# Patient Record
Sex: Male | Born: 1965 | Race: White | Hispanic: No | Marital: Married | Smoking: Former smoker
Health system: Southern US, Community
[De-identification: ages and names within clinical notes are randomized; demographics above are authoritative.]

## PROBLEM LIST (undated history)

## (undated) DIAGNOSIS — R079 Chest pain, unspecified: Secondary | ICD-10-CM

---
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
## 1999-06-10 ENCOUNTER — Encounter: Payer: Self-pay | Admitting: Emergency Medicine

## 1999-06-10 ENCOUNTER — Emergency Department (HOSPITAL_COMMUNITY): Admission: EM | Admit: 1999-06-10 | Discharge: 1999-06-10 | Payer: Self-pay | Admitting: Emergency Medicine

## 2002-06-17 ENCOUNTER — Encounter: Payer: Self-pay | Admitting: Emergency Medicine

## 2002-06-17 ENCOUNTER — Emergency Department (HOSPITAL_COMMUNITY): Admission: EM | Admit: 2002-06-17 | Discharge: 2002-06-17 | Payer: Self-pay | Admitting: Emergency Medicine

## 2006-04-11 ENCOUNTER — Emergency Department (HOSPITAL_COMMUNITY): Admission: EM | Admit: 2006-04-11 | Discharge: 2006-04-12 | Payer: Self-pay | Admitting: Emergency Medicine

## 2006-09-24 ENCOUNTER — Emergency Department (HOSPITAL_COMMUNITY): Admission: EM | Admit: 2006-09-24 | Discharge: 2006-09-24 | Payer: Self-pay | Admitting: Emergency Medicine

## 2010-01-22 ENCOUNTER — Emergency Department (HOSPITAL_COMMUNITY): Admission: EM | Admit: 2010-01-22 | Discharge: 2010-01-22 | Payer: Self-pay | Admitting: Emergency Medicine

## 2010-05-24 LAB — CBC
HCT: 42.8 % (ref 39.0–52.0)
Hemoglobin: 14.8 g/dL (ref 13.0–17.0)
MCH: 33.3 pg (ref 26.0–34.0)
MCHC: 34.6 g/dL (ref 30.0–36.0)
MCV: 96.2 fL (ref 78.0–100.0)
Platelets: 335 10*3/uL (ref 150–400)
RBC: 4.45 MIL/uL (ref 4.22–5.81)
RDW: 13.3 % (ref 11.5–15.5)
WBC: 10 10*3/uL (ref 4.0–10.5)

## 2010-05-24 LAB — COMPREHENSIVE METABOLIC PANEL
ALT: 13 U/L (ref 0–53)
AST: 22 U/L (ref 0–37)
Albumin: 3.3 g/dL — ABNORMAL LOW (ref 3.5–5.2)
Alkaline Phosphatase: 82 U/L (ref 39–117)
BUN: 7 mg/dL (ref 6–23)
CO2: 25 mEq/L (ref 19–32)
Calcium: 8.8 mg/dL (ref 8.4–10.5)
Chloride: 106 mEq/L (ref 96–112)
Creatinine, Ser: 0.88 mg/dL (ref 0.4–1.5)
GFR calc Af Amer: >60 mL/min (ref >60)
GFR calc non Af Amer: >60 mL/min (ref >60)
Glucose, Bld: 91 mg/dL (ref 70–99)
Potassium: 4.2 mEq/L (ref 3.5–5.1)
Sodium: 138 mEq/L (ref 135–145)
Total Bilirubin: 0.5 mg/dL (ref 0.3–1.2)
Total Protein: 6.8 g/dL (ref 6.0–8.3)

## 2010-05-24 LAB — POCT CARDIAC MARKERS
CKMB, poc: <1 ng/mL — ABNORMAL LOW (ref 1.0–8.0)
Myoglobin, poc: 77.9 ng/mL (ref 12–200)
Troponin i, poc: <0.05 ng/mL (ref 0.00–0.09)

## 2010-12-27 LAB — URINALYSIS, ROUTINE W REFLEX MICROSCOPIC
Bilirubin Urine: NEGATIVE
Glucose, UA: NEGATIVE
Hgb urine dipstick: NEGATIVE
Protein, ur: NEGATIVE

## 2010-12-27 LAB — COMPREHENSIVE METABOLIC PANEL
BUN: 9
CO2: 26
Calcium: 8.9
Chloride: 104
Creatinine, Ser: 0.8
GFR calc non Af Amer: >60
Total Bilirubin: 0.5

## 2010-12-27 LAB — DIFFERENTIAL
Basophils Absolute: 0
Lymphocytes Relative: 31
Neutro Abs: 5.7

## 2010-12-27 LAB — CBC
HCT: 46.8
MCHC: 34.6
MCV: 94.6
Platelets: 362
WBC: 9.7

## 2011-04-12 ENCOUNTER — Encounter: Payer: Self-pay | Admitting: Cardiology

## 2011-04-12 ENCOUNTER — Ambulatory Visit: Payer: Self-pay | Admitting: Cardiovascular Disease

## 2011-04-12 ENCOUNTER — Ambulatory Visit (INDEPENDENT_AMBULATORY_CARE_PROVIDER_SITE_OTHER): Payer: Managed Care, Other (non HMO) | Admitting: Cardiology

## 2011-04-12 ENCOUNTER — Encounter: Payer: Self-pay | Admitting: *Deleted

## 2011-04-12 DIAGNOSIS — R079 Chest pain, unspecified: Secondary | ICD-10-CM | POA: Insufficient documentation

## 2011-04-12 DIAGNOSIS — E7800 Pure hypercholesterolemia, unspecified: Secondary | ICD-10-CM

## 2011-04-12 NOTE — Progress Notes (Signed)
Reason for Consult: Chest pain Referring Physician: Dr. Pincus Large Tim Rubio is an 46 y.o. male.  HPI: This pleasant gentleman is seen at the request of Dr. Creta Levin for evaluation of chest pain.  The patient states that he has been experiencing chest pains for about 10 years.  He states that in about 2006 he underwent a stress echo at [facility] cardiology but those records are no longer available.  He was told that no evidence of any blockage was found.  He reports that he has very concerned about his chest pains and discussed this with his primary care provider.  He states that while working down at [employer] last spring he went to the emergency room with chest pain and was ruled out for myocardial infarction.  The emergency room physician told him however that he might still be an eventual victim of a "silent killer" and advised that the patient should undergo a nuclear stress test to be sure that he did not have any blockages.  Patient comes in now to discuss this further.  Of note is the fact that the patient has ongoing smoking of a pack a day.  He has a pertinent family history in that his slightly older brother has had 7 stents.  The patient states that his brother has had cholesterols in the 800 range and that he himself has had cholesterol levels in the 400 range but has been intolerant of any cholesterol medication.  On his own with diet he states that he has been able to bring his total cholesterol down to the 325 range.  At the present time the patient is on no medication at all.  Previously he had been on Lexapro which he stopped.  He does have a past history of chronic anxiety as noted in the notes sent by his primary care provider. 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  As noted he does smoke one pack of cigarettes a day.  No past medical history on file.  Past Surgical History  Procedure Date  . Hernia repair      Family History  Problem Relation Age of Onset  . Diabetes    . Heart disease Brother 39    stents  . Hyperlipidemia    . Hypertension    . Lung cancer      Social History:  reports that he has been smoking Cigarettes.  He has been smoking about 1 pack per day. He does not have any smokeless tobacco history on file. He reports that he drinks alcohol. His drug history not on file.  Allergies: Allergies no known allergies  Medications: I have reviewed the patient's current medications.  Patient takes no medication  No results found for this or any previous visit (from the past 48 hour(s)).  No results found.  The review of systems is negative except as noted above in present illness.  His chest pain is of 2 types.  He has a sharp lancinating-type chest pain which sometimes radiates into his arms.  He also has a dull chest tightness.  Neither of these is consistently brought on by physical activity. Blood pressure 120/60, pulse 70, height 5\' 7"  (1.702 m), weight 177 lb (80.287 kg). The general appearance reveals an anxious middle-aged gentleman in no acute distress.The head and neck exam reveals pupils equal and reactive.  Extraocular movements are full.  There is no scleral icterus.  The mouth and pharynx are normal.  The neck is supple.  The carotids reveal no bruits.  The jugular venous pressure is normal.  The  thyroid is not enlarged.  There is no lymphadenopathy.  The chest is clear to percussion and auscultation.  There are no rales or rhonchi.  Expansion of the chest is symmetrical.  The precordium is quiet.  The first heart sound is normal.  The second heart sound is physiologically split.  There is no murmur gallop rub or click.  There is no abnormal lift or heave.  The abdomen is soft and nontender.  The bowel sounds are normal.  The liver and spleen are not enlarged.  There are no abdominal masses.  There are no abdominal bruits.  Extremities reveal good pedal pulses.  There is no  phlebitis or edema.  There is no cyanosis or clubbing.  Strength is normal and symmetrical in all extremities.  There is no lateralizing weakness.  There are no sensory deficits.  The skin is warm and dry.  There is no rash.  EKG today shows normal sinus rhythm and no ischemic changes.  Assessment/Plan: 1. Chest pain 2. ongoing cigarette abuse 3. hypercholesterolemia by history 4. Anxiety/depression  Plan: We will have the patient return for a treadmill Myoview stress test.  He was counseled about smoking cessation.   Many thanks for the opportunity to see this pleasant gentleman with you and we will be in touch with you regarding the results of the treadmill Myoview stress test  Cassell Clement 04/12/2011, 5:28 PM

## 2011-04-12 NOTE — Patient Instructions (Signed)
Your physician has requested that you have en exercise stress myoview. For further information please visit [email or web address] Please follow instruction sheet, as given.  Will call you with the results

## 2011-04-13 ENCOUNTER — Ambulatory Visit (HOSPITAL_COMMUNITY): Payer: Managed Care, Other (non HMO) | Attending: Cardiovascular Disease | Admitting: Radiology

## 2011-04-13 ENCOUNTER — Encounter (HOSPITAL_COMMUNITY): Payer: Self-pay | Admitting: Radiology

## 2011-04-13 DIAGNOSIS — R079 Chest pain, unspecified: Secondary | ICD-10-CM

## 2011-04-13 DIAGNOSIS — E785 Hyperlipidemia, unspecified: Secondary | ICD-10-CM | POA: Insufficient documentation

## 2011-04-13 DIAGNOSIS — E7800 Pure hypercholesterolemia, unspecified: Secondary | ICD-10-CM | POA: Insufficient documentation

## 2011-04-13 DIAGNOSIS — R61 Generalized hyperhidrosis: Secondary | ICD-10-CM | POA: Insufficient documentation

## 2011-04-13 DIAGNOSIS — Z8249 Family history of ischemic heart disease and other diseases of the circulatory system: Secondary | ICD-10-CM | POA: Insufficient documentation

## 2011-04-13 DIAGNOSIS — F17200 Nicotine dependence, unspecified, uncomplicated: Secondary | ICD-10-CM | POA: Insufficient documentation

## 2011-04-13 DIAGNOSIS — R002 Palpitations: Secondary | ICD-10-CM | POA: Insufficient documentation

## 2011-04-13 MED ORDER — TECHNETIUM TC 99M TETROFOSMIN IV KIT
10.0000 | PACK | Freq: Once | INTRAVENOUS | Status: AC | PRN
  Administered 2011-04-13: 10 via INTRAVENOUS

## 2011-04-13 MED ORDER — TECHNETIUM TC 99M TETROFOSMIN IV KIT
30.0000 | PACK | Freq: Once | INTRAVENOUS | Status: AC | PRN
  Administered 2011-04-13: 30 via INTRAVENOUS

## 2011-04-13 NOTE — Progress Notes (Signed)
[facility] SITE 3 NUCLEAR MED [address] [place] [address] [phone]  Cardiology Nuclear Med Study  Tim Rubio is a 46 y.o. male [id number] [birth year]   Nuclear Med Background Indication for Stress Test:  Evaluation for Ischemia History:2006  Stress Echo- Normal per patient Cardiac Risk Factors: Family History - CAD, Lipids and Smoker  Symptoms:  Chest Pain, Diaphoresis and Palpitations   Nuclear Pre-Procedure Caffeine/Decaff Intake:  None NPO After: 7:30pm   Lungs:  Clear IV 0.9% NS with Angio Cath:  20g  IV Site: R Antecubital  IV Started by:  Stanton Kidney, EMT-P  Chest Size (in):  40 Cup Size: n/a  Height: 5\' 7"  (1.702 m)  Weight:  176 lb (79.833 kg)  BMI:  Body mass index is 27.57 kg/(m^2). Tech Comments:  NA    Nuclear Med Study 1 or 2 day study: 1 day  Stress Test Type:  Stress  Reading MD: Charlton Haws, MD  Order Authorizing Provider:  Cassell Clement, MD  Resting Radionuclide: Technetium 34m Tetrofosmin  Resting Radionuclide Dose: 11.0 mCi   Stress Radionuclide:  Technetium 60m Tetrofosmin  Stress Radionuclide Dose: 33.0 mCi           Stress Protocol Rest HR: 63 Stress HR: 155  Rest BP: 107/73 Stress BP: 159/77  Exercise Time (min): 10:45 METS: 11.7   Predicted Max HR: 174 bpm % Max HR: 89.08 bpm Rate Pressure Product: 21308   Dose of Adenosine (mg):  n/a Dose of Lexiscan: n/a mg  Dose of Atropine (mg): n/a Dose of Dobutamine: n/a mcg/kg/min (at max HR)  Stress Test Technologist: Bonnita Levan, RN  Nuclear Technologist:  Domenic Polite, CNMT     Rest Procedure:  Myocardial perfusion imaging was performed at rest 45 minutes following the intravenous administration of Technetium 12m Tetrofosmin. Rest ECG: NSR  Stress Procedure:  The patient exercised for 10:45.  The patient stopped due to leg fatigue and dyspnea and denied any chest pain.  There were no significant ST-T wave changes.  Technetium 72m Tetrofosmin was  injected at peak exercise and myocardial perfusion imaging was performed after a brief delay. Stress ECG: No significant change from baseline ECG  QPS Raw Data Images:  Normal; no motion artifact; normal heart/lung ratio. Stress Images:  Normal homogeneous uptake in all areas of the myocardium. Rest Images:  Normal homogeneous uptake in all areas of the myocardium. Subtraction (SDS):  Normal Transient Ischemic Dilatation (Normal <1.22):  0.99 Lung/Heart Ratio (Normal <0.45):  0.37  Quantitative Gated Spect Images QGS EDV:  95 ml QGS ESV:  34 ml QGS cine images:  NL LV Function; NL Wall Motion QGS EF: 64%  Impression Exercise Capacity:  Good exercise capacity. BP Response:  Normal blood pressure response. Clinical Symptoms:  No chest pain. ECG Impression:  No significant ST segment change suggestive of ischemia. Comparison with Prior Nuclear Study: No images to compare  Overall Impression:  Normal stress nuclear study.  Charlton Haws

## 2011-04-14 ENCOUNTER — Telehealth: Payer: Self-pay | Admitting: Cardiology

## 2011-04-14 NOTE — Telephone Encounter (Signed)
Pt very nervous and didn't want to wait all weekend for results of stress test. Informed pt that Dr Patty Sermons has not seen the formal results yet but the preliminary results where normal. Told him he will receive final result next week, pt verbalized understanding.

## 2011-04-14 NOTE — Telephone Encounter (Signed)
New problem Pt calling about test results

## 2011-04-17 ENCOUNTER — Telehealth: Payer: Self-pay | Admitting: Cardiology

## 2011-04-17 ENCOUNTER — Telehealth: Payer: Self-pay | Admitting: *Deleted

## 2011-04-17 NOTE — Telephone Encounter (Signed)
Pt rtn someone's call not sure who called

## 2011-04-17 NOTE — Telephone Encounter (Signed)
Advised of stress test results 

## 2011-04-17 NOTE — Telephone Encounter (Signed)
Advised of results and forwarded to Dr Tonna Corner.  If pain comes back call back for further follow up

## 2011-04-17 NOTE — Telephone Encounter (Signed)
Message copied by Burnell Blanks on Mon Apr 17, 2011  3:02 PM ------      Message from: Cassell Clement      Created: Mon Apr 17, 2011  6:03 AM       Please report.  The stress test was normal.  There were no blockages. The EF is normal.      Continue strict low cholesterol diet, follow up with Dr. Creta Levin.      Copy to Dr. Creta Levin

## 2011-06-12 ENCOUNTER — Encounter: Payer: Self-pay | Admitting: Cardiology

## 2012-09-13 ENCOUNTER — Observation Stay (HOSPITAL_COMMUNITY)
Admission: EM | Admit: 2012-09-13 | Discharge: 2012-09-15 | Disposition: A | Discharge status: Home / Self Care | Payer: BC Managed Care – PPO | Attending: Surgery | Admitting: Surgery

## 2012-09-13 ENCOUNTER — Emergency Department (HOSPITAL_COMMUNITY): Payer: BC Managed Care – PPO

## 2012-09-13 ENCOUNTER — Encounter (HOSPITAL_COMMUNITY): Payer: Self-pay | Admitting: Emergency Medicine

## 2012-09-13 DIAGNOSIS — K8020 Calculus of gallbladder without cholecystitis without obstruction: Secondary | ICD-10-CM

## 2012-09-13 DIAGNOSIS — R112 Nausea with vomiting, unspecified: Secondary | ICD-10-CM

## 2012-09-13 DIAGNOSIS — K8000 Calculus of gallbladder with acute cholecystitis without obstruction: Secondary | ICD-10-CM | POA: Insufficient documentation

## 2012-09-13 DIAGNOSIS — R1011 Right upper quadrant pain: Secondary | ICD-10-CM

## 2012-09-13 LAB — POCT I-STAT, CHEM 8
BUN: 22 mg/dL (ref 6–23)
Chloride: 104 mEq/L (ref 96–112)
Creatinine, Ser: 1.2 mg/dL (ref 0.50–1.35)
Potassium: 4.1 mEq/L (ref 3.5–5.1)
Sodium: 138 mEq/L (ref 135–145)

## 2012-09-13 LAB — CBC WITH DIFFERENTIAL/PLATELET
Basophils Absolute: 0.1 10*3/uL (ref 0.0–0.1)
HCT: 46.4 % (ref 39.0–52.0)
Hemoglobin: 16.6 g/dL (ref 13.0–17.0)
Lymphocytes Relative: 31 % (ref 12–46)
Lymphs Abs: 3.9 10*3/uL (ref 0.7–4.0)
Monocytes Absolute: 1.2 10*3/uL — ABNORMAL HIGH (ref 0.1–1.0)
Monocytes Relative: 9 % (ref 3–12)
Neutro Abs: 6.9 10*3/uL (ref 1.7–7.7)
RBC: 4.91 MIL/uL (ref 4.22–5.81)
WBC: 12.4 10*3/uL — ABNORMAL HIGH (ref 4.0–10.5)

## 2012-09-13 LAB — COMPREHENSIVE METABOLIC PANEL
ALT: 13 U/L (ref 0–53)
Alkaline Phosphatase: 112 U/L (ref 39–117)
GFR calc Af Amer: >90 mL/min (ref >90)
Glucose, Bld: 112 mg/dL — ABNORMAL HIGH (ref 70–99)
Potassium: 4.1 mEq/L (ref 3.5–5.1)
Sodium: 137 mEq/L (ref 135–145)
Total Protein: 7.4 g/dL (ref 6.0–8.3)

## 2012-09-13 LAB — URINALYSIS, ROUTINE W REFLEX MICROSCOPIC
Glucose, UA: NEGATIVE mg/dL
Hgb urine dipstick: NEGATIVE
Specific Gravity, Urine: 1.023 (ref 1.005–1.030)
Urobilinogen, UA: 1 mg/dL (ref 0.0–1.0)

## 2012-09-13 LAB — POCT I-STAT TROPONIN I

## 2012-09-13 MED ORDER — DIPHENHYDRAMINE HCL 50 MG/ML IJ SOLN: 12.5000 mg | Freq: Four times a day (QID) | INTRAMUSCULAR | Status: DC | PRN

## 2012-09-13 MED ORDER — FENTANYL CITRATE 0.05 MG/ML IJ SOLN
50.0000 ug | Freq: Once | INTRAMUSCULAR | Status: AC
  Administered 2012-09-13: 50 ug via INTRAVENOUS
  Filled 2012-09-13: qty 2

## 2012-09-13 MED ORDER — DIPHENHYDRAMINE HCL 12.5 MG/5ML PO ELIX: 12.5000 mg | ORAL_SOLUTION | Freq: Four times a day (QID) | ORAL | Status: DC | PRN

## 2012-09-13 MED ORDER — HYDROMORPHONE HCL PF 1 MG/ML IJ SOLN
1.0000 mg | Freq: Once | INTRAMUSCULAR | Status: AC
  Administered 2012-09-13: 1 mg via INTRAVENOUS
  Filled 2012-09-13: qty 1

## 2012-09-13 MED ORDER — ONDANSETRON HCL 4 MG/2ML IJ SOLN
4.0000 mg | Freq: Four times a day (QID) | INTRAMUSCULAR | Status: DC | PRN
  Administered 2012-09-14: 4 mg via INTRAVENOUS

## 2012-09-13 MED ORDER — ONDANSETRON HCL 4 MG/2ML IJ SOLN
4.0000 mg | Freq: Once | INTRAMUSCULAR | Status: AC
  Administered 2012-09-13: 4 mg via INTRAVENOUS
  Filled 2012-09-13: qty 2

## 2012-09-13 MED ORDER — SODIUM CHLORIDE 0.9 % IV SOLN
INTRAVENOUS | Status: DC
  Administered 2012-09-13 – 2012-09-14 (×2): via INTRAVENOUS

## 2012-09-13 MED ORDER — FENTANYL CITRATE 0.05 MG/ML IJ SOLN
50.0000 ug | Freq: Once | INTRAMUSCULAR | Status: AC
  Administered 2012-09-13: 50 ug via INTRAVENOUS

## 2012-09-13 MED ORDER — HYDROMORPHONE HCL PF 1 MG/ML IJ SOLN
1.0000 mg | INTRAMUSCULAR | Status: DC | PRN
  Administered 2012-09-14: 1 mg via INTRAVENOUS
  Administered 2012-09-14: 0.5 mg via INTRAVENOUS
  Administered 2012-09-14 (×2): 1 mg via INTRAVENOUS
  Administered 2012-09-15: 0.5 mg via INTRAVENOUS
  Filled 2012-09-13 (×5): qty 1

## 2012-09-13 NOTE — ED Notes (Signed)
Pain currently 5/10 achy dull sharp RUQ abdomen

## 2012-09-13 NOTE — Progress Notes (Signed)
Pt educated on Incentive Spirometry and was able to return demonstrate with teach back. Ordered and explained SCD's to pt. Pt reported that he doesn't need those and might not use them till surgery. Pt has been mobile and able to walk without any issues. Pt reported that he is usually very active. Pt reported that he smokes a pack of cigarettes a day and hasn't tried nicotine patches in the past but might want one while here.

## 2012-09-13 NOTE — H&P (Signed)
Tim Rubio is an 47 y.o. male.   Chief Complaint: Abdominal pain, nausea, vomiting Req: Dr. Silverio Lay HPI: 47 yr old male who presented to [facility] with abdominal pain, nausea and vomiting.  He has actually had abdominal pain for about 5 days that has been waxing and waning.  It began to worsen over the last 24 hrous and he woke at 4am with severe pain.  He denies history of this.  It is associated with nausea and vomiting.  His pain is better with the medicine but not resolved.  He had a inguinal hernia repair as a baby.    U/S showed cholelithiasis and Adenomyomatosis.  Surgery is ask to see to evaluate need for cholecystectomy   History reviewed. No pertinent past medical history.  Past Surgical History  Procedure Laterality Date  . Hernia repair      Family History  Problem Relation Age of Onset  . Diabetes    . Heart disease Brother 39    stents  . Hyperlipidemia    . Hypertension    . Lung cancer     Social History:  reports that he has been smoking Cigarettes.  He has been smoking about 1.00 pack per day. He does not have any smokeless tobacco history on file. He reports that  drinks alcohol. He reports that he does not use illicit drugs.  Allergies: No Known Allergies   (Not in a hospital admission)  Results for orders placed during the hospital encounter of 09/13/12 (from the past 48 hour(s))  CBC WITH DIFFERENTIAL     Status: Abnormal   Collection Time    09/13/12  7:23 AM      Result Value Range   WBC 12.4 (*) 4.0 - 10.5 K/uL   RBC 4.91  4.22 - 5.81 MIL/uL   Hemoglobin 16.6  13.0 - 17.0 g/dL   HCT 56.2  13.0 - 86.5 %   MCV 94.5  78.0 - 100.0 fL   MCH 33.8  26.0 - 34.0 pg   MCHC 35.8  30.0 - 36.0 g/dL   RDW 78.4  69.6 - 29.5 %   Platelets 348  150 - 400 K/uL   Neutrophils Relative % 55  43 - 77 %   Neutro Abs 6.9  1.7 - 7.7 K/uL   Lymphocytes Relative 31  12 - 46 %   Lymphs Abs 3.9  0.7 - 4.0 K/uL   Monocytes Relative 9  3 - 12 %   Monocytes Absolute 1.2 (*) 0.1  - 1.0 K/uL   Eosinophils Relative 4  0 - 5 %   Eosinophils Absolute 0.5  0.0 - 0.7 K/uL   Basophils Relative 0  0 - 1 %   Basophils Absolute 0.1  0.0 - 0.1 K/uL  LIPASE, BLOOD     Status: None   Collection Time    09/13/12  7:23 AM      Result Value Range   Lipase 21  11 - 59 U/L  COMPREHENSIVE METABOLIC PANEL     Status: Abnormal   Collection Time    09/13/12  7:37 AM      Result Value Range   Sodium 137  135 - 145 mEq/L   Potassium 4.1  3.5 - 5.1 mEq/L   Chloride 101  96 - 112 mEq/L   CO2 22  19 - 32 mEq/L   Glucose, Bld 112 (*) 70 - 99 mg/dL   BUN 20  6 - 23 mg/dL   Creatinine, Ser  1.05  0.50 - 1.35 mg/dL   Calcium 8.9  8.4 - 95.6 mg/dL   Total Protein 7.4  6.0 - 8.3 g/dL   Albumin 3.4 (*) 3.5 - 5.2 g/dL   AST 25  0 - 37 U/L   ALT 13  0 - 53 U/L   Alkaline Phosphatase 112  39 - 117 U/L   Total Bilirubin 0.3  0.3 - 1.2 mg/dL   GFR calc non Af Amer 82 (*) >90 mL/min   GFR calc Af Amer >90  >90 mL/min   Comment:            The eGFR has been calculated     using the CKD EPI equation.     This calculation has not been     validated in all clinical     situations.     eGFR's persistently     <90 mL/min signify     possible Chronic Kidney Disease.  POCT I-STAT TROPONIN I     Status: None   Collection Time    09/13/12  7:55 AM      Result Value Range   Troponin i, poc 0.00  0.00 - 0.08 ng/mL   Comment 3            Comment: Due to the release kinetics of cTnI,     a negative result within the first hours     of the onset of symptoms does not rule out     myocardial infarction with certainty.     If myocardial infarction is still suspected,     repeat the test at appropriate intervals.  CG4 I-STAT (LACTIC ACID)     Status: None   Collection Time    09/13/12  7:56 AM      Result Value Range   Lactic Acid, Venous 1.28  0.5 - 2.2 mmol/L  POCT I-STAT, CHEM 8     Status: Abnormal   Collection Time    09/13/12  7:57 AM      Result Value Range   Sodium 138  135 - 145  mEq/L   Potassium 4.1  3.5 - 5.1 mEq/L   Chloride 104  96 - 112 mEq/L   BUN 22  6 - 23 mg/dL   Creatinine, Ser 2.13  0.50 - 1.35 mg/dL   Glucose, Bld 086 (*) 70 - 99 mg/dL   Calcium, Ion 5.78 (*) 1.12 - 1.23 mmol/L   TCO2 24  0 - 100 mmol/L   Hemoglobin 17.0  13.0 - 17.0 g/dL   HCT 46.9  62.9 - 52.8 %  SAMPLE TO BLOOD BANK     Status: None   Collection Time    09/13/12  8:35 AM      Result Value Range   Blood Bank Specimen SAMPLE AVAILABLE FOR TESTING     Sample Expiration 09/14/2012    URINALYSIS, ROUTINE W REFLEX MICROSCOPIC     Status: Abnormal   Collection Time    09/13/12  9:08 AM      Result Value Range   Color, Urine YELLOW  YELLOW   APPearance CLEAR  CLEAR   Specific Gravity, Urine 1.023  1.005 - 1.030   pH 7.0  5.0 - 8.0   Glucose, UA NEGATIVE  NEGATIVE mg/dL   Hgb urine dipstick NEGATIVE  NEGATIVE   Bilirubin Urine NEGATIVE  NEGATIVE   Ketones, ur 15 (*) NEGATIVE mg/dL   Protein, ur NEGATIVE  NEGATIVE mg/dL   Urobilinogen, UA 1.0  0.0 - 1.0 mg/dL   Nitrite NEGATIVE  NEGATIVE   Leukocytes, UA NEGATIVE  NEGATIVE   Comment: MICROSCOPIC NOT DONE ON URINES WITH NEGATIVE PROTEIN, BLOOD, LEUKOCYTES, NITRITE, OR GLUCOSE <1000 mg/dL.   US Abdomen Complete  09/13/2012   *RADIOLOGY REPORT*  Clinical Data:  Abdominal pain with nausea and vomiting.  COMPLETE ABDOMINAL ULTRASOUND  Comparison:  Radiographs dated 09/13/2012  Findings:  Gallbladder:  There is at least one stone in the neck of the gallbladder.  Echogenic areas in the gallbladder wall consistent with adenomyomatosis. Positive sonographic Murphy's sign.  Common bile duct:  Normal.  4.2 mm in diameter.  Liver:  No focal lesion identified.  IVC:  Normal.  Pancreas:  Head and body are normal.  The tail is obscured by bowel gas.  Spleen:  Normal.  6.7 cm in length.  Right Kidney:  Normal.  11.5 cm in length.  Left Kidney:  Normal.  10.6 cm in length.  Abdominal aorta:  Normal.  2.3 cm in diameter.  IMPRESSION: Cholelithiasis.   Adenomyomatosis.  Positive sonographic Murphy's sign.   Original Report Authenticated By: Francene Boyers, M.D.   Dg Abd Acute W/chest  09/13/2012   *RADIOLOGY REPORT*  Clinical Data: Abdominal pain, nausea/vomiting  ACUTE ABDOMEN SERIES (ABDOMEN 2 VIEW & CHEST 1 VIEW)  Comparison: Chest radiographs dated 01/22/2010  Findings: Increased interstitial markings/bronchitic changes.  No focal consolidation. No pleural effusion or pneumothorax.  The heart is normal in size.  Nonobstructive bowel gas pattern.  No evidence of free air under the diaphragm on the upright view.  Visualized osseous structures are within normal limits.  IMPRESSION: No evidence of acute cardiopulmonary disease.  No evidence of small bowel obstruction or free air.   Original Report Authenticated By: Charline Bills, M.D.    Review of Systems  Constitutional: Negative.   HENT: Negative.   Eyes: Negative.   Respiratory: Negative.   Cardiovascular: Negative.   Gastrointestinal: Positive for nausea, vomiting and abdominal pain.  Genitourinary: Negative.   Musculoskeletal: Negative.   Skin: Negative.   Neurological: Negative.   Endo/Heme/Allergies: Negative.   Psychiatric/Behavioral: Negative.     Blood pressure 108/72, pulse 64, temperature 98.3 F (36.8 C), temperature source Oral, resp. rate 16, SpO2 96.00%. Physical Exam  Constitutional: He is oriented to person, place, and time. He appears well-developed and well-nourished. No distress.  HENT:  Head: Normocephalic and atraumatic.  Eyes: Conjunctivae are normal. Pupils are equal, round, and reactive to light.  Neck: Normal range of motion. Neck supple.  Cardiovascular: Normal rate and regular rhythm.   Respiratory: Effort normal and breath sounds normal.  GI: Soft. He exhibits distension (mild). There is tenderness (RUQ and epigastric).  Genitourinary:  Deferred   Musculoskeletal: Normal range of motion. He exhibits no edema.  Neurological: He is alert and oriented  to person, place, and time.  Skin: Skin is warm and dry.  Psychiatric: He has a normal mood and affect. His behavior is normal.     Assessment/Plan 1. Symptomatic cholelithiasis/RUQ pain/nausea/vomiting: will admit the patient, place him on IVFs and antibiotic.  Will plan for lap chole tomorrow.  Will keep NPO for now but will discuss with Dr. Lindie Spruce about clear liquids today.  Discussed the procedure and expected post-operative course with the patient.    Tim Rubio 09/13/2012, 12:47 PM

## 2012-09-13 NOTE — Progress Notes (Signed)
UR completed 

## 2012-09-13 NOTE — ED Notes (Signed)
Consult surgery at bedside

## 2012-09-13 NOTE — ED Notes (Signed)
Pt returned from xray still hurting.

## 2012-09-13 NOTE — H&P (Signed)
Will probably go ahead and get cholecystectomy tomorrow. AM  Marta Lamas. Gae Bon, MD, FACS [phone] [facility] Surgery

## 2012-09-13 NOTE — ED Provider Notes (Signed)
History    CSN: [id number] Arrival date & time 09/13/12  0708  First MD Initiated Contact with Patient 09/13/12 [phone]     Chief Complaint  Patient presents with  . Abdominal Pain   (Consider location/radiation/quality/duration/timing/severity/associated sxs/prior Treatment) HPI Tim Rubio is a 47 y.o. male who presents to ED complaining of abdominal pain, nausea, vomiting. States symptoms began at 4 am. Severe epigastric pain, worsened when laying down, better when sitting up. States vomiting several times, clear emesis. No diarrhea, last bowel movement yesterday. States has had two similar episodes in the last week, both resolved. States today took peptobismol with no improvement. Denies prior abdomina problems, no prior surgeries. No chest pain, but states he feels like he canot breath well. No fever. No diarrhea.    No past medical history on file. Past Surgical History  Procedure Laterality Date  . Hernia repair     Family History  Problem Relation Age of Onset  . Diabetes    . Heart disease Brother 39    stents  . Hyperlipidemia    . Hypertension    . Lung cancer     History  Substance Use Topics  . Smoking status: Current Every Day Smoker -- 1.00 packs/day    Types: Cigarettes  . Smokeless tobacco: Not on file  . Alcohol Use: Yes     Comment: Social drinker    Review of Systems  Constitutional: Negative for fever and chills.  Respiratory: Negative.   Cardiovascular: Negative.   Gastrointestinal: Positive for nausea, vomiting and abdominal pain. Negative for diarrhea and constipation.  Genitourinary: Negative for dysuria and flank pain.  Neurological: Negative for dizziness, weakness and headaches.  All other systems reviewed and are negative.    Allergies  Review of patient's allergies indicates no known allergies.  Home Medications  No current outpatient prescriptions on file. BP 131/86  Pulse 63  Temp(Src) 98.3 F (36.8 C) (Oral)  Resp 20  SpO2  99% Physical Exam  Nursing note and vitals reviewed. Constitutional: He appears well-developed and well-nourished.  Pt appears to be in pain, pacing in the room, holding his abdomen  HENT:  Head: Normocephalic.  Cardiovascular: Normal rate, regular rhythm and normal heart sounds.   Pulmonary/Chest: Effort normal and breath sounds normal. No respiratory distress. He has no wheezes. He has no rales.  Abdominal: Normal appearance. He exhibits no abdominal bruit, no ascites and no pulsatile midline mass. Bowel sounds are decreased. There is tenderness in the right upper quadrant, epigastric area and left upper quadrant. There is guarding. There is no CVA tenderness.  Musculoskeletal: He exhibits no edema.  Neurological: He is alert.  Skin: Skin is warm and dry.    ED Course  Procedures (including critical care time) Labs Reviewed  CBC WITH DIFFERENTIAL  LIPASE, BLOOD  URINALYSIS, ROUTINE W REFLEX MICROSCOPIC  COMPREHENSIVE METABOLIC PANEL  SAMPLE TO BLOOD BANK   8:16 AM Pt appears in severe pain. Concerning for acute abdomen, abdomen is hard, guarding on exam. Acute abd series obtained and are negative for free air. Labs pending. Will get [place], localized more to RUQ now.  Results for orders placed during the hospital encounter of 09/13/12  CBC WITH DIFFERENTIAL      Result Value Range   WBC 12.4 (*) 4.0 - 10.5 K/uL   RBC 4.91  4.22 - 5.81 MIL/uL   Hemoglobin 16.6  13.0 - 17.0 g/dL   HCT 27.2  53.6 - 64.4 %   MCV 94.5  78.0 - 100.0 fL   MCH 33.8  26.0 - 34.0 pg   MCHC 35.8  30.0 - 36.0 g/dL   RDW 14.7  82.9 - 56.2 %   Platelets 348  150 - 400 K/uL   Neutrophils Relative % 55  43 - 77 %   Neutro Abs 6.9  1.7 - 7.7 K/uL   Lymphocytes Relative 31  12 - 46 %   Lymphs Abs 3.9  0.7 - 4.0 K/uL   Monocytes Relative 9  3 - 12 %   Monocytes Absolute 1.2 (*) 0.1 - 1.0 K/uL   Eosinophils Relative 4  0 - 5 %   Eosinophils Absolute 0.5  0.0 - 0.7 K/uL   Basophils Relative 0  0 - 1 %    Basophils Absolute 0.1  0.0 - 0.1 K/uL  LIPASE, BLOOD      Result Value Range   Lipase 21  11 - 59 U/L  URINALYSIS, ROUTINE W REFLEX MICROSCOPIC      Result Value Range   Color, Urine YELLOW  YELLOW   APPearance CLEAR  CLEAR   Specific Gravity, Urine 1.023  1.005 - 1.030   pH 7.0  5.0 - 8.0   Glucose, UA NEGATIVE  NEGATIVE mg/dL   Hgb urine dipstick NEGATIVE  NEGATIVE   Bilirubin Urine NEGATIVE  NEGATIVE   Ketones, ur 15 (*) NEGATIVE mg/dL   Protein, ur NEGATIVE  NEGATIVE mg/dL   Urobilinogen, UA 1.0  0.0 - 1.0 mg/dL   Nitrite NEGATIVE  NEGATIVE   Leukocytes, UA NEGATIVE  NEGATIVE  COMPREHENSIVE METABOLIC PANEL      Result Value Range   Sodium 137  135 - 145 mEq/L   Potassium 4.1  3.5 - 5.1 mEq/L   Chloride 101  96 - 112 mEq/L   CO2 22  19 - 32 mEq/L   Glucose, Bld 112 (*) 70 - 99 mg/dL   BUN 20  6 - 23 mg/dL   Creatinine, Ser 1.30  0.50 - 1.35 mg/dL   Calcium 8.9  8.4 - 86.5 mg/dL   Total Protein 7.4  6.0 - 8.3 g/dL   Albumin 3.4 (*) 3.5 - 5.2 g/dL   AST 25  0 - 37 U/L   ALT 13  0 - 53 U/L   Alkaline Phosphatase 112  39 - 117 U/L   Total Bilirubin 0.3  0.3 - 1.2 mg/dL   GFR calc non Af Amer 82 (*) >90 mL/min   GFR calc Af Amer >90  >90 mL/min  CG4 I-STAT (LACTIC ACID)      Result Value Range   Lactic Acid, Venous 1.28  0.5 - 2.2 mmol/L  POCT I-STAT, CHEM 8      Result Value Range   Sodium 138  135 - 145 mEq/L   Potassium 4.1  3.5 - 5.1 mEq/L   Chloride 104  96 - 112 mEq/L   BUN 22  6 - 23 mg/dL   Creatinine, Ser 7.84  0.50 - 1.35 mg/dL   Glucose, Bld 696 (*) 70 - 99 mg/dL   Calcium, Ion 2.95 (*) 1.12 - 1.23 mmol/L   TCO2 24  0 - 100 mmol/L   Hemoglobin 17.0  13.0 - 17.0 g/dL   HCT 28.4  13.2 - 44.0 %  POCT I-STAT TROPONIN I      Result Value Range   Troponin i, poc 0.00  0.00 - 0.08 ng/mL   Comment 3  SAMPLE TO BLOOD BANK      Result Value Range   Blood Bank Specimen SAMPLE AVAILABLE FOR TESTING     Sample Expiration 09/14/2012      Dg Abd Acute  W/chest  09/13/2012   *RADIOLOGY REPORT*  Clinical Data: Abdominal pain, nausea/vomiting  ACUTE ABDOMEN SERIES (ABDOMEN 2 VIEW & CHEST 1 VIEW)  Comparison: Chest radiographs dated 01/22/2010  Findings: Increased interstitial markings/bronchitic changes.  No focal consolidation. No pleural effusion or pneumothorax.  The heart is normal in size.  Nonobstructive bowel gas pattern.  No evidence of free air under the diaphragm on the upright view.  Visualized osseous structures are within normal limits.  IMPRESSION: No evidence of acute cardiopulmonary disease.  No evidence of small bowel obstruction or free air.   Original Report Authenticated By: Charline Bills, M.D.  US Abdomen Complete  09/13/2012   *RADIOLOGY REPORT*  Clinical Data:  Abdominal pain with nausea and vomiting.  COMPLETE ABDOMINAL ULTRASOUND  Comparison:  Radiographs dated 09/13/2012  Findings:  Gallbladder:  There is at least one stone in the neck of the gallbladder.  Echogenic areas in the gallbladder wall consistent with adenomyomatosis. Positive sonographic Murphy's sign.  Common bile duct:  Normal.  4.2 mm in diameter.  Liver:  No focal lesion identified.  IVC:  Normal.  Pancreas:  Head and body are normal.  The tail is obscured by bowel gas.  Spleen:  Normal.  6.7 cm in length.  Right Kidney:  Normal.  11.5 cm in length.  Left Kidney:  Normal.  10.6 cm in length.  Abdominal aorta:  Normal.  2.3 cm in diameter.  IMPRESSION: Cholelithiasis.  Adenomyomatosis.  Positive sonographic Murphy's sign.   Original Report Authenticated By: Francene Boyers, M.D.    11:21 AM [place] as above. Spoke with Surgery, will come see pt. Pt's pain improved, however continues to have some discomfort.      1. Cholelithiasis     MDM  Pt with recurrent upper abdominal pain, nausea, vomiting. ttp in RUQ. Labs and US obtained which showed elevated WBC of 12, cholelithiasis. Normal LFTs and lipase. Concerning for biliary colick. Pt's pain only mildly improved with  pain medications. Consulted surgery who came saw pt, will admit for cholecystectomy tomorrow.   Filed Vitals:   09/13/12 1300 09/13/12 1330 09/13/12 1400 09/13/12 1433  BP: 114/71 115/82 120/73 126/81  Pulse: 56 69 66 57  Temp:    97.9 F (36.6 C)  TempSrc:    Oral  Resp: 14 16 16 17   Height:    5\' 7"  (1.702 m)  Weight:    169 lb 8.5 oz (76.9 kg)  SpO2: 96% 96% 97% 99%     Lottie Mussel, PA-C 09/13/12 1556

## 2012-09-13 NOTE — ED Notes (Signed)
Pt reports 5 days ago aquired stomach virus and stomach-ache. Pt states for the last 5 days has had persistent nausea/vomiting, mild diarrhea. Pt c/o pain in upper abdomen. Pt reports dizziness. Unable to tolerate any foods by mouth. Pt alert, oriented x4, moderate distress noted. Skin, warm and dry.

## 2012-09-14 ENCOUNTER — Encounter (HOSPITAL_COMMUNITY)
Admission: EM | Disposition: A | Discharge status: Home / Self Care | Payer: Self-pay | Source: Home / Self Care | Attending: Emergency Medicine

## 2012-09-14 ENCOUNTER — Observation Stay (HOSPITAL_COMMUNITY): Payer: BC Managed Care – PPO

## 2012-09-14 ENCOUNTER — Encounter (HOSPITAL_COMMUNITY): Payer: Self-pay | Admitting: Anesthesiology

## 2012-09-14 ENCOUNTER — Observation Stay (HOSPITAL_COMMUNITY): Payer: BC Managed Care – PPO | Admitting: Anesthesiology

## 2012-09-14 DIAGNOSIS — K8010 Calculus of gallbladder with chronic cholecystitis without obstruction: Secondary | ICD-10-CM

## 2012-09-14 HISTORY — PX: CHOLECYSTECTOMY: SHX55

## 2012-09-14 LAB — COMPREHENSIVE METABOLIC PANEL
ALT: 10 U/L (ref 0–53)
AST: 19 U/L (ref 0–37)
Albumin: 2.8 g/dL — ABNORMAL LOW (ref 3.5–5.2)
Alkaline Phosphatase: 100 U/L (ref 39–117)
BUN: 14 mg/dL (ref 6–23)
CO2: 23 mEq/L (ref 19–32)
Calcium: 8.4 mg/dL (ref 8.4–10.5)
Chloride: 103 mEq/L (ref 96–112)
Creatinine, Ser: 0.92 mg/dL (ref 0.50–1.35)
GFR calc Af Amer: >90 mL/min (ref >90)
GFR calc non Af Amer: >90 mL/min (ref >90)
Glucose, Bld: 97 mg/dL (ref 70–99)
Potassium: 4.3 mEq/L (ref 3.5–5.1)
Sodium: 139 mEq/L (ref 135–145)
Total Bilirubin: 0.5 mg/dL (ref 0.3–1.2)
Total Protein: 6.3 g/dL (ref 6.0–8.3)

## 2012-09-14 LAB — SURGICAL PCR SCREEN
MRSA, PCR: NEGATIVE
Staphylococcus aureus: NEGATIVE

## 2012-09-14 SURGERY — LAPAROSCOPIC CHOLECYSTECTOMY WITH INTRAOPERATIVE CHOLANGIOGRAM
Anesthesia: General | Site: Abdomen | Wound class: Clean Contaminated

## 2012-09-14 MED ORDER — PROPOFOL 10 MG/ML IV BOLUS
INTRAVENOUS | Status: DC | PRN
  Administered 2012-09-14: 200 mg via INTRAVENOUS

## 2012-09-14 MED ORDER — ONDANSETRON HCL 4 MG/2ML IJ SOLN
4.0000 mg | Freq: Four times a day (QID) | INTRAMUSCULAR | Status: AC | PRN
  Administered 2012-09-14: 4 mg via INTRAVENOUS
  Filled 2012-09-14: qty 2

## 2012-09-14 MED ORDER — DEXAMETHASONE SODIUM PHOSPHATE 4 MG/ML IJ SOLN
INTRAMUSCULAR | Status: DC | PRN
  Administered 2012-09-14: 8 mg via INTRAVENOUS

## 2012-09-14 MED ORDER — SODIUM CHLORIDE 0.9 % IV SOLN
INTRAVENOUS | Status: DC | PRN
  Administered 2012-09-14: 10:00:00

## 2012-09-14 MED ORDER — ENOXAPARIN SODIUM 40 MG/0.4ML ~~LOC~~ SOLN
40.0000 mg | SUBCUTANEOUS | Status: DC
  Administered 2012-09-15: 40 mg via SUBCUTANEOUS
  Filled 2012-09-14 (×2): qty 0.4

## 2012-09-14 MED ORDER — CEFAZOLIN SODIUM-DEXTROSE 2-3 GM-% IV SOLR
INTRAVENOUS | Status: DC | PRN
  Administered 2012-09-14: 2 g via INTRAVENOUS

## 2012-09-14 MED ORDER — NEOSTIGMINE METHYLSULFATE 1 MG/ML IJ SOLN
INTRAMUSCULAR | Status: DC | PRN
  Administered 2012-09-14: 2 mg via INTRAVENOUS

## 2012-09-14 MED ORDER — SODIUM CHLORIDE 0.9 % IR SOLN
Status: DC | PRN
  Administered 2012-09-14: 1000 mL

## 2012-09-14 MED ORDER — SODIUM CHLORIDE 0.9 % IV SOLN
INTRAVENOUS | Status: DC
  Administered 2012-09-14 – 2012-09-15 (×2): via INTRAVENOUS

## 2012-09-14 MED ORDER — IBUPROFEN 800 MG PO TABS: 800.0000 mg | ORAL_TABLET | Freq: Four times a day (QID) | ORAL | Status: DC | PRN

## 2012-09-14 MED ORDER — BUPIVACAINE-EPINEPHRINE 0.25% -1:200000 IJ SOLN
INTRAMUSCULAR | Status: DC | PRN
  Administered 2012-09-14: 10 mL

## 2012-09-14 MED ORDER — LACTATED RINGERS IV SOLN
INTRAVENOUS | Status: DC | PRN
  Administered 2012-09-14: 10:00:00 via INTRAVENOUS

## 2012-09-14 MED ORDER — OXYCODONE HCL 5 MG PO TABS: 5.0000 mg | ORAL_TABLET | Freq: Once | ORAL | Status: AC | PRN

## 2012-09-14 MED ORDER — SODIUM CHLORIDE 0.9 % IR SOLN
Status: DC | PRN
  Administered 2012-09-14: 1

## 2012-09-14 MED ORDER — FENTANYL CITRATE 0.05 MG/ML IJ SOLN
INTRAMUSCULAR | Status: DC | PRN
  Administered 2012-09-14: 100 ug via INTRAVENOUS
  Administered 2012-09-14: 150 ug via INTRAVENOUS

## 2012-09-14 MED ORDER — ONDANSETRON HCL 4 MG/2ML IJ SOLN
INTRAMUSCULAR | Status: DC | PRN
  Administered 2012-09-14: 4 mg via INTRAVENOUS

## 2012-09-14 MED ORDER — OXYCODONE HCL 5 MG/5ML PO SOLN: 5.0000 mg | Freq: Once | ORAL | Status: AC | PRN

## 2012-09-14 MED ORDER — GLYCOPYRROLATE 0.2 MG/ML IJ SOLN
INTRAMUSCULAR | Status: DC | PRN
  Administered 2012-09-14: 0.4 mg via INTRAVENOUS

## 2012-09-14 MED ORDER — HYDROMORPHONE HCL PF 1 MG/ML IJ SOLN: 0.2500 mg | INTRAMUSCULAR | Status: DC | PRN

## 2012-09-14 MED ORDER — CEFAZOLIN SODIUM 1-5 GM-% IV SOLN
1.0000 g | Freq: Three times a day (TID) | INTRAVENOUS | Status: DC
  Administered 2012-09-14 – 2012-09-15 (×3): 1 g via INTRAVENOUS
  Filled 2012-09-14 (×4): qty 50

## 2012-09-14 MED ORDER — LIDOCAINE HCL (CARDIAC) 20 MG/ML IV SOLN
INTRAVENOUS | Status: DC | PRN
  Administered 2012-09-14: 70 mg via INTRAVENOUS

## 2012-09-14 MED ORDER — ROCURONIUM BROMIDE 100 MG/10ML IV SOLN
INTRAVENOUS | Status: DC | PRN
  Administered 2012-09-14: 40 mg via INTRAVENOUS

## 2012-09-14 SURGICAL SUPPLY — 43 items
APL SKNCLS STERI-STRIP NONHPOA (GAUZE/BANDAGES/DRESSINGS) ×1
APPLIER CLIP ROT 10 11.4 M/L (STAPLE) ×2
APR CLP MED LRG 11.4X10 (STAPLE) ×1
BAG SPEC RTRVL LRG 6X4 10 (ENDOMECHANICALS) ×1
BENZOIN TINCTURE PRP APPL 2/3 (GAUZE/BANDAGES/DRESSINGS) ×2 IMPLANT
BLADE SURG ROTATE 9660 (MISCELLANEOUS) ×1 IMPLANT
CANISTER SUCTION 2500CC (MISCELLANEOUS) ×2 IMPLANT
CHLORAPREP W/TINT 26ML (MISCELLANEOUS) ×2 IMPLANT
CLIP APPLIE ROT 10 11.4 M/L (STAPLE) ×1 IMPLANT
CLOTH BEACON ORANGE TIMEOUT ST (SAFETY) ×2 IMPLANT
COVER MAYO STAND STRL (DRAPES) ×2 IMPLANT
COVER SURGICAL LIGHT HANDLE (MISCELLANEOUS) ×2 IMPLANT
DECANTER SPIKE VIAL GLASS SM (MISCELLANEOUS) ×4 IMPLANT
DRAPE C-ARM 42X72 X-RAY (DRAPES) ×2 IMPLANT
DRAPE UTILITY 15X26 W/TAPE STR (DRAPE) ×4 IMPLANT
DRSG TEGADERM 4X4.75 (GAUZE/BANDAGES/DRESSINGS) ×2 IMPLANT
ELECT REM PT RETURN 9FT ADLT (ELECTROSURGICAL) ×2
ELECTRODE REM PT RTRN 9FT ADLT (ELECTROSURGICAL) ×1 IMPLANT
FILTER SMOKE EVAC LAPAROSHD (FILTER) ×2 IMPLANT
GAUZE SPONGE 2X2 8PLY STRL LF (GAUZE/BANDAGES/DRESSINGS) ×1 IMPLANT
GLOVE BIO SURGEON STRL SZ7 (GLOVE) ×2 IMPLANT
GLOVE BIOGEL PI IND STRL 7.5 (GLOVE) ×1 IMPLANT
GLOVE BIOGEL PI INDICATOR 7.5 (GLOVE) ×1
GOWN STRL NON-REIN LRG LVL3 (GOWN DISPOSABLE) ×8 IMPLANT
KIT BASIN OR (CUSTOM PROCEDURE TRAY) ×2 IMPLANT
KIT ROOM TURNOVER OR (KITS) ×2 IMPLANT
NS IRRIG 1000ML POUR BTL (IV SOLUTION) ×2 IMPLANT
PAD ARMBOARD 7.5X6 YLW CONV (MISCELLANEOUS) ×2 IMPLANT
POUCH SPECIMEN RETRIEVAL 10MM (ENDOMECHANICALS) ×2 IMPLANT
SCISSORS LAP 5X35 DISP (ENDOMECHANICALS) IMPLANT
SET CHOLANGIOGRAPH 5 50 .035 (SET/KITS/TRAYS/PACK) ×2 IMPLANT
SET IRRIG TUBING LAPAROSCOPIC (IRRIGATION / IRRIGATOR) ×2 IMPLANT
SLEEVE ENDOPATH XCEL 5M (ENDOMECHANICALS) ×2 IMPLANT
SPECIMEN JAR SMALL (MISCELLANEOUS) ×2 IMPLANT
SPONGE GAUZE 2X2 STER 10/PKG (GAUZE/BANDAGES/DRESSINGS) ×1
STRIP CLOSURE SKIN 1/2X4 (GAUZE/BANDAGES/DRESSINGS) ×1 IMPLANT
SUT MNCRL AB 4-0 PS2 18 (SUTURE) ×2 IMPLANT
TOWEL OR 17X24 6PK STRL BLUE (TOWEL DISPOSABLE) ×2 IMPLANT
TOWEL OR 17X26 10 PK STRL BLUE (TOWEL DISPOSABLE) ×2 IMPLANT
TRAY LAPAROSCOPIC (CUSTOM PROCEDURE TRAY) ×2 IMPLANT
TROCAR XCEL BLUNT TIP 100MML (ENDOMECHANICALS) ×2 IMPLANT
TROCAR XCEL NON-BLD 11X100MML (ENDOMECHANICALS) ×2 IMPLANT
TROCAR XCEL NON-BLD 5MMX100MML (ENDOMECHANICALS) ×2 IMPLANT

## 2012-09-14 NOTE — Anesthesia Procedure Notes (Signed)
Procedure Name: Intubation Date/Time: 09/14/2012 9:42 AM Performed by: Alanda Amass A Pre-anesthesia Checklist: Patient identified, Timeout performed, Emergency Drugs available, Suction available and Patient being monitored Patient Re-evaluated:Patient Re-evaluated prior to inductionOxygen Delivery Method: Circle system utilized Preoxygenation: Pre-oxygenation with 100% oxygen Intubation Type: IV induction Ventilation: Mask ventilation without difficulty and Oral airway inserted - appropriate to patient size Laryngoscope Size: Mac and 3 Grade View: Grade I Tube type: Oral Tube size: 8.0 mm Number of attempts: 1 Airway Equipment and Method: Stylet Secured at: 21 cm Tube secured with: Tape Dental Injury: Teeth and Oropharynx as per pre-operative assessment

## 2012-09-14 NOTE — Transfer of Care (Signed)
Immediate Anesthesia Transfer of Care Note  Patient: Tim Rubio  Procedure(s) Performed: Procedure(s): LAPAROSCOPIC CHOLECYSTECTOMY WITH INTRAOPERATIVE CHOLANGIOGRAM (N/A)  Patient Location: PACU  Anesthesia Type:General  Level of Consciousness: sedated  Airway & Oxygen Therapy: Patient Spontanous Breathing and Patient connected to nasal cannula oxygen  Post-op Assessment: Report given to PACU RN and Post -op Vital signs reviewed and stable  Post vital signs: Reviewed and stable  Complications: No apparent anesthesia complications

## 2012-09-14 NOTE — Anesthesia Postprocedure Evaluation (Signed)
Anesthesia Post Note  Patient: Tim Rubio  Procedure(s) Performed: Procedure(s) (LRB): LAPAROSCOPIC CHOLECYSTECTOMY WITH INTRAOPERATIVE CHOLANGIOGRAM (N/A)  Anesthesia type: General  Patient location: PACU  Post pain: Pain level controlled and Adequate analgesia  Post assessment: Post-op Vital signs reviewed, Patient's Cardiovascular Status Stable, Respiratory Function Stable, Patent Airway and Pain level controlled  Last Vitals:  Filed Vitals:   09/14/12 1418  BP: 88/47  Pulse: 76  Temp: 36.6 C  Resp: 16    Post vital signs: Reviewed and stable  Level of consciousness: awake, alert  and oriented  Complications: No apparent anesthesia complications

## 2012-09-14 NOTE — ED Provider Notes (Signed)
Medical screening examination/treatment/procedure(s) were conducted as a shared visit with non-physician practitioner(s) and myself.  I personally evaluated the patient during the encounter  [name] is a 47 y.o. male here with epigastric pain and nausea and vomiting. Tender in RUQ, US showed cholelithiasis with sonographic murphy. Pain not relieved with pain meds. Surgery consulted and will perform cholecystectomy tomorrow.    Richardean Canal, MD 09/14/12 [phone]

## 2012-09-14 NOTE — Op Note (Signed)
Laparoscopic Cholecystectomy with IOC Procedure Note  Indications: This patient presents with symptomatic gallbladder disease and will undergo laparoscopic cholecystectomy.  Pre-operative Diagnosis: Calculus of gallbladder with acute cholecystitis, without mention of obstruction  Post-operative Diagnosis: Same  Surgeon: Huber Mathers K.   Assistants: Dr. Estelle Grumbles  Anesthesia: General endotracheal anesthesia  ASA Class: 1  Procedure Details  The patient was seen again in the Holding Room. The risks, benefits, complications, treatment options, and expected outcomes were discussed with the patient. The possibilities of reaction to medication, pulmonary aspiration, perforation of viscus, bleeding, recurrent infection, finding a normal gallbladder, the need for additional procedures, failure to diagnose a condition, the possible need to convert to an open procedure, and creating a complication requiring transfusion or operation were discussed with the patient. The likelihood of improving the patient's symptoms with return to their baseline status is good.  The patient and/or family concurred with the proposed plan, giving informed consent. The site of surgery properly noted. The patient was taken to Operating Room, identified as Tim Rubio and the procedure verified as Laparoscopic Cholecystectomy with Intraoperative Cholangiogram. A Time Out was held and the above information confirmed.  Prior to the induction of general anesthesia, antibiotic prophylaxis was administered. General endotracheal anesthesia was then administered and tolerated well. After the induction, the abdomen was prepped with Chloraprep and draped in the sterile fashion. The patient was positioned in the supine position.  Local anesthetic agent was injected into the skin near the umbilicus and an incision made. We dissected down to the abdominal fascia with blunt dissection.  The fascia was incised vertically and we  entered the peritoneal cavity bluntly.  A pursestring suture of 0-Vicryl was placed around the fascial opening.  The Hasson cannula was inserted and secured with the stay suture.  Pneumoperitoneum was then created with CO2 and tolerated well without any adverse changes in the patient's vital signs. An 11-mm port was placed in the subxiphoid position.  Two 5-mm ports were placed in the right upper quadrant. All skin incisions were infiltrated with a local anesthetic agent before making the incision and placing the trocars.   We positioned the patient in reverse Trendelenburg, tilted slightly to the patient's left.  The gallbladder was identified and was quite edematous and thickened.  The gallbladder was aspirated with the suction aspirator, the fundus grasped and retracted cephalad. Adhesions were lysed bluntly and with the electrocautery where indicated, taking care not to injure any adjacent organs or viscus. The infundibulum was grasped and retracted laterally, exposing the peritoneum overlying the triangle of Calot. This was then divided and exposed in a blunt fashion. A critical view of the cystic duct and cystic artery was obtained.  The cystic duct was clearly identified and bluntly dissected circumferentially. The cystic duct was ligated with a clip distally.   An incision was made in the cystic duct and the [facility] cholangiogram catheter introduced. The catheter was secured using a clip. A cholangiogram was then obtained which showed good visualization of the distal and proximal biliary tree with no sign of filling defects or obstruction.  Contrast flowed easily into the duodenum. The catheter was then removed.   The cystic duct was then ligated with clips and divided. The cystic artery was identified, dissected free, ligated with clips and divided as well.   The gallbladder was dissected from the liver bed in retrograde fashion with the electrocautery. The gallbladder was removed and placed in an  Endocatch sac. The liver bed was irrigated and  inspected. Hemostasis was achieved with the electrocautery. Copious irrigation was utilized and was repeatedly aspirated until clear.  The gallbladder and Endocatch sac were then removed through the umbilical port site.  The pursestring suture was used to close the umbilical fascia.    We again inspected the right upper quadrant for hemostasis.  Pneumoperitoneum was released as we removed the trocars.  4-0 Monocryl was used to close the skin.   Benzoin, steri-strips, and clean dressings were applied. The patient was then extubated and brought to the recovery room in stable condition. Instrument, sponge, and needle counts were correct at closure and at the conclusion of the case.   Findings: Cholecystitis with Cholelithiasis  Estimated Blood Loss: Minimal         Drains: noen         Specimens: Gallbladder           Complications: None; patient tolerated the procedure well.         Disposition: PACU - hemodynamically stable.         Condition: stable   Tim Arms. Corliss Skains, MD, [facility] Surgery  General/ Trauma Surgery  09/14/2012 10:32 AM

## 2012-09-14 NOTE — Progress Notes (Signed)
Day of Surgery  Subjective: Met with patient and discussed surgery.  No further questions.  Objective: Vital signs in last 24 hours: Temp:  [97.7 F (36.5 C)-98.9 F (37.2 C)] 98.4 F (36.9 C) (07/05 0829) Pulse Rate:  [56-87] 62 (07/05 0829) Resp:  [14-17] 16 (07/05 0829) BP: (99-126)/(59-88) 105/59 mmHg (07/05 0829) SpO2:  [95 %-99 %] 95 % (07/05 0829) Weight:  [169 lb 8.5 oz (76.9 kg)] 169 lb 8.5 oz (76.9 kg) (07/04 1433) Last BM Date: 09/12/12  Intake/Output from previous day: 07/04 0701 - 07/05 0700 In: 1436.7 [I.V.:1436.7] Out: -  Intake/Output this shift:    GI: mild RUQ tenderness  Lab Results:   Recent Labs  09/13/12 0723 09/13/12 0757  WBC 12.4*  --   HGB 16.6 17.0  HCT 46.4 50.0  PLT 348  --    BMET  Recent Labs  09/13/12 0737 09/13/12 0757 09/14/12 0535  NA 137 138 139  K 4.1 4.1 4.3  CL 101 104 103  CO2 22  --  23  GLUCOSE 112* 113* 97  BUN 20 22 14   CREATININE 1.05 1.20 0.92  CALCIUM 8.9  --  8.4   Lab Results  Component Value Date   ALT 10 09/14/2012   AST 19 09/14/2012   ALKPHOS 100 09/14/2012   BILITOT 0.5 09/14/2012    PT/INR No results found for this basename: LABPROT, INR,  in the last 72 hours ABG No results found for this basename: PHART, PCO2, PO2, HCO3,  in the last 72 hours  Studies/Results: US Abdomen Complete  09/13/2012   *RADIOLOGY REPORT*  Clinical Data:  Abdominal pain with nausea and vomiting.  COMPLETE ABDOMINAL ULTRASOUND  Comparison:  Radiographs dated 09/13/2012  Findings:  Gallbladder:  There is at least one stone in the neck of the gallbladder.  Echogenic areas in the gallbladder wall consistent with adenomyomatosis. Positive sonographic Murphy's sign.  Common bile duct:  Normal.  4.2 mm in diameter.  Liver:  No focal lesion identified.  IVC:  Normal.  Pancreas:  Head and body are normal.  The tail is obscured by bowel gas.  Spleen:  Normal.  6.7 cm in length.  Right Kidney:  Normal.  11.5 cm in length.  Left Kidney:   Normal.  10.6 cm in length.  Abdominal aorta:  Normal.  2.3 cm in diameter.  IMPRESSION: Cholelithiasis.  Adenomyomatosis.  Positive sonographic Murphy's sign.   Original Report Authenticated By: Francene Boyers, M.D.   Dg Abd Acute W/chest  09/13/2012   *RADIOLOGY REPORT*  Clinical Data: Abdominal pain, nausea/vomiting  ACUTE ABDOMEN SERIES (ABDOMEN 2 VIEW & CHEST 1 VIEW)  Comparison: Chest radiographs dated 01/22/2010  Findings: Increased interstitial markings/bronchitic changes.  No focal consolidation. No pleural effusion or pneumothorax.  The heart is normal in size.  Nonobstructive bowel gas pattern.  No evidence of free air under the diaphragm on the upright view.  Visualized osseous structures are within normal limits.  IMPRESSION: No evidence of acute cardiopulmonary disease.  No evidence of small bowel obstruction or free air.   Original Report Authenticated By: Charline Bills, M.D.    Anti-infectives: Anti-infectives   None      Assessment/Plan: s/p Procedure(s): LAPAROSCOPIC CHOLECYSTECTOMY WITH INTRAOPERATIVE CHOLANGIOGRAM (N/A) The surgical procedure has been discussed with the patient.  Potential risks, benefits, alternative treatments, and expected outcomes have been explained.  All of the patient's questions at this time have been answered.  The likelihood of reaching the patient's treatment goal is good.  The patient understand the proposed surgical procedure and wishes to proceed.   LOS: 1 day    Lailie Smead K. 09/14/2012

## 2012-09-14 NOTE — Anesthesia Preprocedure Evaluation (Addendum)
Anesthesia Evaluation  Patient identified by MRN, date of birth, ID band Patient awake    Reviewed: Allergy & Precautions, H&P , NPO status , Patient's Chart, lab work & pertinent test results  Airway Mallampati: II  Neck ROM: full    Dental  (+) Dental Advisory Given   Pulmonary Current Smoker,          Cardiovascular     Neuro/Psych    GI/Hepatic   Endo/Other    Renal/GU      Musculoskeletal   Abdominal   Peds  Hematology   Anesthesia Other Findings Hx mandible fx, mult. Teeth with crowns  Reproductive/Obstetrics                          Anesthesia Physical Anesthesia Plan  ASA: II  Anesthesia Plan: General   Post-op Pain Management:    Induction: Intravenous  Airway Management Planned: Oral ETT  Additional Equipment:   Intra-op Plan:   Post-operative Plan: Extubation in OR  Informed Consent: I have reviewed the patients History and Physical, chart, labs and discussed the procedure including the risks, benefits and alternatives for the proposed anesthesia with the patient or authorized representative who has indicated his/her understanding and acceptance.     Plan Discussed with: CRNA, Anesthesiologist and Surgeon  Anesthesia Plan Comments:         Anesthesia Quick Evaluation

## 2012-09-15 DIAGNOSIS — K8000 Calculus of gallbladder with acute cholecystitis without obstruction: Secondary | ICD-10-CM | POA: Diagnosis present

## 2012-09-15 MED ORDER — AMOXICILLIN-POT CLAVULANATE 875-125 MG PO TABS: 1.0000 | ORAL_TABLET | Freq: Two times a day (BID) | ORAL | Status: DC

## 2012-09-15 MED ORDER — OXYCODONE HCL 5 MG PO TABS: 5.0000 mg | ORAL_TABLET | ORAL | Status: DC | PRN

## 2012-09-15 NOTE — Discharge Summary (Signed)
Physician Discharge Summary  Patient ID: EBAN WEICK MRN: [id number] DOB/AGE: Sep 27, 1965 47 y.o.  Admit date: 09/13/2012 Discharge date: 09/15/2012  Admission Diagnoses:  Acute cholecystitis  Discharge Diagnoses:  Principal Problem:   Calculus of gallbladder with acute cholecystitis s/p lap chole with IOC 7/5.   Discharged Condition: good  Hospital Course: He was admitted and underwent the above procedure on 7/5 which he tolerated well.  He was able to be discharged on POD #1 on Augmentin for one week and Oxy IR for pain.  Instructions were given to him.  Consults: None  Significant Diagnostic Studies: none  Treatments: surgery: Laparoscopic cholecystectomy with IOC  Discharge Exam: Blood pressure 102/47, pulse 72, temperature 98.4 F (36.9 C), temperature source Oral, resp. rate 18, height 5\' 7"  (1.702 m), weight 169 lb 8.5 oz (76.9 kg), SpO2 97.00%.   Disposition: Final discharge disposition not confirmed     Medication List         amoxicillin-clavulanate 875-125 MG per tablet  Commonly known as:  AUGMENTIN  Take 1 tablet by mouth 2 (two) times daily.     ibuprofen 200 MG tablet  Commonly known as:  ADVIL,MOTRIN  Take 800 mg by mouth every 6 (six) hours as needed for pain.     oxyCODONE 5 MG immediate release tablet  Commonly known as:  Oxy IR/ROXICODONE  Take 1-2 tablets (5-10 mg total) by mouth every 4 (four) hours as needed for pain.         Signed: Adolph Pollack 09/15/2012, 10:13 AM

## 2012-09-15 NOTE — Progress Notes (Signed)
1 Day Post-Op  Subjective: A little sore.  Tolerated breakfast.   Objective: Vital signs in last 24 hours: Temp:  [97.7 F (36.5 C)-98.5 F (36.9 C)] 98.4 F (36.9 C) (07/06 0949) Pulse Rate:  [52-76] 72 (07/06 0949) Resp:  [12-18] 18 (07/06 0949) BP: (88-119)/(45-74) 102/47 mmHg (07/06 0949) SpO2:  [93 %-99 %] 97 % (07/06 0949) Last BM Date: 09/12/12  Intake/Output from previous day: 07/05 0701 - 07/06 0700 In: 1480 [P.O.:480; I.V.:1000] Out: 10 [Blood:10] Intake/Output this shift: Total I/O In: 240 [P.O.:240] Out: -   PE: General- In NAD Abdomen-soft, dressings dry.  Lab Results:   Recent Labs  09/13/12 0723 09/13/12 0757  WBC 12.4*  --   HGB 16.6 17.0  HCT 46.4 50.0  PLT 348  --    BMET  Recent Labs  09/13/12 0737 09/13/12 0757 09/14/12 0535  NA 137 138 139  K 4.1 4.1 4.3  CL 101 104 103  CO2 22  --  23  GLUCOSE 112* 113* 97  BUN 20 22 14   CREATININE 1.05 1.20 0.92  CALCIUM 8.9  --  8.4   PT/INR No results found for this basename: LABPROT, INR,  in the last 72 hours Comprehensive Metabolic Panel:    Component Value Date/Time   NA 139 09/14/2012 0535   K 4.3 09/14/2012 0535   CL 103 09/14/2012 0535   CO2 23 09/14/2012 0535   BUN 14 09/14/2012 0535   CREATININE 0.92 09/14/2012 0535   GLUCOSE 97 09/14/2012 0535   CALCIUM 8.4 09/14/2012 0535   AST 19 09/14/2012 0535   ALT 10 09/14/2012 0535   ALKPHOS 100 09/14/2012 0535   BILITOT 0.5 09/14/2012 0535   PROT 6.3 09/14/2012 0535   ALBUMIN 2.8* 09/14/2012 0535     Studies/Results: Dg Cholangiogram Operative  09/14/2012   *RADIOLOGY REPORT*  Clinical Data: Symptomatic cholelithiasis.  INTRAOPERATIVE CHOLANGIOGRAM  Technique:  C-arm fluoroscopic images were obtained intraoperatively and submitted for postoperative interpretation. Please see the performing provider's procedural report for the fluoroscopy time utilized.  Comparison: Ultrasound 09/13/2012  Findings: There is opacification of the intrahepatic and extrahepatic  biliary system.  Contrast drains into the duodenum. No large filling defects.  The biliary system is not dilated.  IMPRESSION: Patent biliary system without obstructing stones or lesions.   Original Report Authenticated By: Richarda Overlie, M.D.   US Abdomen Complete  09/13/2012   *RADIOLOGY REPORT*  Clinical Data:  Abdominal pain with nausea and vomiting.  COMPLETE ABDOMINAL ULTRASOUND  Comparison:  Radiographs dated 09/13/2012  Findings:  Gallbladder:  There is at least one stone in the neck of the gallbladder.  Echogenic areas in the gallbladder wall consistent with adenomyomatosis. Positive sonographic Murphy's sign.  Common bile duct:  Normal.  4.2 mm in diameter.  Liver:  No focal lesion identified.  IVC:  Normal.  Pancreas:  Head and body are normal.  The tail is obscured by bowel gas.  Spleen:  Normal.  6.7 cm in length.  Right Kidney:  Normal.  11.5 cm in length.  Left Kidney:  Normal.  10.6 cm in length.  Abdominal aorta:  Normal.  2.3 cm in diameter.  IMPRESSION: Cholelithiasis.  Adenomyomatosis.  Positive sonographic Murphy's sign.   Original Report Authenticated By: Francene Boyers, M.D.    Anti-infectives: Anti-infectives   Start     Dose/Rate Route Frequency Ordered Stop   09/14/12 1800  ceFAZolin (ANCEF) IVPB 1 g/50 mL premix     1 g 100 mL/hr over 30  Minutes Intravenous Every 8 hours 09/14/12 1210        Assessment Principal Problem:   Calculus of gallbladder with acute cholecystitis s/p lap chole with IOC 7/5-doing well.    LOS: 2 days   Plan: Discharge on oral antibiotics.  RTC in 2 weeks.  Instructions given.   Laekyn Rayos J 09/15/2012

## 2012-09-16 ENCOUNTER — Encounter (HOSPITAL_COMMUNITY): Payer: Self-pay | Admitting: Surgery

## 2012-12-10 ENCOUNTER — Emergency Department (HOSPITAL_COMMUNITY)
Admission: EM | Admit: 2012-12-10 | Discharge: 2012-12-10 | Disposition: A | Discharge status: Home / Self Care | Payer: BC Managed Care – PPO | Attending: Emergency Medicine | Admitting: Emergency Medicine

## 2012-12-10 ENCOUNTER — Encounter (HOSPITAL_COMMUNITY): Payer: Self-pay | Admitting: *Deleted

## 2012-12-10 ENCOUNTER — Encounter (HOSPITAL_COMMUNITY): Payer: Self-pay | Admitting: Emergency Medicine

## 2012-12-10 ENCOUNTER — Emergency Department (INDEPENDENT_AMBULATORY_CARE_PROVIDER_SITE_OTHER)
Admission: EM | Admit: 2012-12-10 | Discharge: 2012-12-10 | Disposition: A | Discharge status: Home / Self Care | Payer: BC Managed Care – PPO | Source: Home / Self Care

## 2012-12-10 DIAGNOSIS — R410 Disorientation, unspecified: Secondary | ICD-10-CM

## 2012-12-10 DIAGNOSIS — G459 Transient cerebral ischemic attack, unspecified: Secondary | ICD-10-CM

## 2012-12-10 DIAGNOSIS — R4789 Other speech disturbances: Secondary | ICD-10-CM | POA: Insufficient documentation

## 2012-12-10 DIAGNOSIS — R5381 Other malaise: Secondary | ICD-10-CM | POA: Insufficient documentation

## 2012-12-10 DIAGNOSIS — Z79899 Other long term (current) drug therapy: Secondary | ICD-10-CM | POA: Insufficient documentation

## 2012-12-10 DIAGNOSIS — R400 Somnolence: Secondary | ICD-10-CM

## 2012-12-10 DIAGNOSIS — R531 Weakness: Secondary | ICD-10-CM

## 2012-12-10 DIAGNOSIS — R0789 Other chest pain: Secondary | ICD-10-CM

## 2012-12-10 DIAGNOSIS — H538 Other visual disturbances: Secondary | ICD-10-CM

## 2012-12-10 DIAGNOSIS — R079 Chest pain, unspecified: Secondary | ICD-10-CM

## 2012-12-10 DIAGNOSIS — R42 Dizziness and giddiness: Secondary | ICD-10-CM | POA: Insufficient documentation

## 2012-12-10 DIAGNOSIS — R109 Unspecified abdominal pain: Secondary | ICD-10-CM

## 2012-12-10 DIAGNOSIS — R4182 Altered mental status, unspecified: Secondary | ICD-10-CM | POA: Insufficient documentation

## 2012-12-10 DIAGNOSIS — R404 Transient alteration of awareness: Secondary | ICD-10-CM | POA: Insufficient documentation

## 2012-12-10 DIAGNOSIS — F17200 Nicotine dependence, unspecified, uncomplicated: Secondary | ICD-10-CM | POA: Insufficient documentation

## 2012-12-10 HISTORY — DX: Chest pain, unspecified: R07.9

## 2012-12-10 LAB — BASIC METABOLIC PANEL
BUN: 12 mg/dL (ref 6–23)
CO2: 26 mEq/L (ref 19–32)
Calcium: 8.8 mg/dL (ref 8.4–10.5)
Chloride: 102 mEq/L (ref 96–112)
Creatinine, Ser: 0.84 mg/dL (ref 0.50–1.35)
GFR calc non Af Amer: >90 mL/min (ref >90)
Glucose, Bld: 58 mg/dL — ABNORMAL LOW (ref 70–99)
Sodium: 139 mEq/L (ref 135–145)

## 2012-12-10 LAB — CBC
Hemoglobin: 16.4 g/dL (ref 13.0–17.0)
MCH: 33.7 pg (ref 26.0–34.0)
MCHC: 35.5 g/dL (ref 30.0–36.0)
MCV: 95.1 fL (ref 78.0–100.0)
Platelets: 335 10*3/uL (ref 150–400)
RBC: 4.86 MIL/uL (ref 4.22–5.81)

## 2012-12-10 LAB — POCT I-STAT TROPONIN I: Troponin i, poc: 0 ng/mL (ref 0.00–0.08)

## 2012-12-10 NOTE — ED Provider Notes (Signed)
CSN: [id number]     Arrival date & time 12/10/12  2024 History   First MD Initiated Contact with Patient 12/10/12 2104     Chief Complaint  Patient presents with  . Chest Pain   (Consider location/radiation/quality/duration/timing/severity/associated sxs/prior Treatment) HPI 47 y.o. 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  Pt has been in normal state of health with chronic chest pain, however today for approximately one sentence felt lightheaded and had word finding difficulty and confusion.  This resolved when pt concentrated.  He went to see his PCP and due to concern for TIA was sent here for further evaluation.  No recent fevers, gi symptoms, change in character of chest pain, or other concerning findings.  Symptoms self resolved, no exacerbating or alleviating factors.  Timing was constant.   Past Medical History  Diagnosis Date  . Chest pain     10 or more episodes- undiagnosed with frequent ED visits   Past Surgical History  Procedure Laterality Date  . Cholecystectomy N/A 09/14/2012    Procedure: LAPAROSCOPIC CHOLECYSTECTOMY WITH INTRAOPERATIVE CHOLANGIOGRAM;  Surgeon: Wilmon Arms. Corliss Skains, MD;  Location: [facility] OR;  Service: General;  Laterality: N/A;  . Hernia repair  1968   Family History  Problem Relation Age of Onset  . Diabetes    . Heart disease Brother 39    stents  . Hyperlipidemia    . Hypertension    . Lung cancer    . Hypertension Mother   . Diabetes Mother   . Hypertension Father    History  Substance Use Topics  . Smoking status: Current Every Day Smoker -- 1.00 packs/day    Types: Cigarettes  . Smokeless tobacco: Not on file  . Alcohol Use: 1.2 oz/week    2 Shots of liquor per week     Comment: Social drinker    Review of Systems  Constitutional: Negative for fever and chills.  HENT: Negative for congestion, sore throat and rhinorrhea.   Eyes:  Negative for photophobia and visual disturbance.  Respiratory: Negative for cough and shortness of breath.   Cardiovascular: Positive for chest pain. Negative for leg swelling.  Gastrointestinal: Negative for nausea, vomiting, abdominal pain, diarrhea and constipation.  Endocrine: Negative for polydipsia and polyuria.  Genitourinary: Negative for dysuria and hematuria.  Musculoskeletal: Negative for back pain and arthralgias.  Skin: Negative for color change and rash.  Neurological: Positive for speech difficulty and light-headedness. Negative for dizziness, syncope and headaches.  Hematological: Negative for adenopathy. Does not bruise/bleed easily.  All other systems reviewed and are negative.    Allergies  Review of patient's allergies indicates no known allergies.  Home Medications   Current Outpatient Rx  Name  Route  Sig  Dispense  Refill  . acidophilus (RISAQUAD) CAPS capsule   Oral   Take 1 capsule by mouth daily.         Marland Kitchen ibuprofen (ADVIL,MOTRIN) 200 MG tablet   Oral   Take 800 mg by mouth every 6 (six) hours as needed for pain.         Marland Kitchen amoxicillin-clavulanate (AUGMENTIN) 875-125 MG per tablet   Oral   Take 1 tablet by mouth 2 (two) times daily.   14 tablet   0    BP 122/76  Pulse 74  Temp(Src) 98.6 F (37 C) (Oral)  Resp 18  Ht 5\' 7"  (1.702 m)  Wt 175  lb (79.379 kg)  BMI 27.4 kg/m2  SpO2 95% Physical Exam  Vitals reviewed. Constitutional: He is oriented to person, place, and time. He appears well-developed and well-nourished.  HENT:  Head: Normocephalic and atraumatic.  Eyes: Conjunctivae and EOM are normal.  Neck: Normal range of motion. Neck supple.  Cardiovascular: Normal rate, regular rhythm and normal heart sounds.   Pulmonary/Chest: Effort normal and breath sounds normal. No respiratory distress.  Abdominal: He exhibits no distension. There is no tenderness. There is no rebound and no guarding.  Musculoskeletal: Normal range of motion.   Neurological: He is alert and oriented to person, place, and time. He has normal strength and normal reflexes. No cranial nerve deficit or sensory deficit. Gait normal.  Skin: Skin is warm and dry.    ED Course  Procedures (including critical care time) Labs Review Labs Reviewed  CBC - Abnormal; Notable for the following:    WBC 12.9 (*)    All other components within normal limits  BASIC METABOLIC PANEL - Abnormal; Notable for the following:    Glucose, Bld 58 (*)    All other components within normal limits  POCT I-STAT TROPONIN I   Results for orders placed during the hospital encounter of 12/10/12  CBC      Result Value Range   WBC 12.9 (*) 4.0 - 10.5 K/uL   RBC 4.86  4.22 - 5.81 MIL/uL   Hemoglobin 16.4  13.0 - 17.0 g/dL   HCT 16.1  09.6 - 04.5 %   MCV 95.1  78.0 - 100.0 fL   MCH 33.7  26.0 - 34.0 pg   MCHC 35.5  30.0 - 36.0 g/dL   RDW 40.9  81.1 - 91.4 %   Platelets 335  150 - 400 K/uL  BASIC METABOLIC PANEL      Result Value Range   Sodium 139  135 - 145 mEq/L   Potassium 4.0  3.5 - 5.1 mEq/L   Chloride 102  96 - 112 mEq/L   CO2 26  19 - 32 mEq/L   Glucose, Bld 58 (*) 70 - 99 mg/dL   BUN 12  6 - 23 mg/dL   Creatinine, Ser 7.82  0.50 - 1.35 mg/dL   Calcium 8.8  8.4 - 95.6 mg/dL   GFR calc non Af Amer >90  >90 mL/min   GFR calc Af Amer >90  >90 mL/min  POCT I-STAT TROPONIN I      Result Value Range   Troponin i, poc 0.00  0.00 - 0.08 ng/mL   Comment 3             Imaging Review No results found.  Date: 12/11/2012  Rate: 75  Rhythm: normal sinus rhythm  QRS Axis: normal  Intervals: normal  ST/T Wave abnormalities: normal  Conduction Disutrbances: none  Narrative Interpretation:   Old EKG Reviewed: No significant changes noted   MDM   1. Chest pain   2. Generalized weakness   3. Drowsiness   4. Confusion    47 y.o. male  with pertinent PMH of chronic chest pain presents with altered mental status as described above.  Sent from PCP for same.   Physical exam as above without acute focal neuro findings.  Labs with hypoglycemia.  Given no change in character or nature of chest pain, and very transient symptoms which were not apparent in the ed, consider hypoglycemia likely etiology.  Doubt CVA, dissection, or other emergent neuro pathology given brief and specific symptoms, no  focal neuro findings, and overall well appearance of pt.  Pt states he has had chest pain for 10 years, without change in character today.  Stable to dc home with pcp fu.    Labs and imaging as above reviewed by myself and attending,Dr. Rosalia Hammers, with whom case was discussed.   1. Chest pain   2. Generalized weakness   3. Drowsiness   4. Confusion         Noel Gerold, MD 12/11/12 [phone]

## 2012-12-10 NOTE — ED Notes (Signed)
Blurred vision both eyes can not see up close or far away has changed dramatically in last few days, headache right side above right eye started 3 days ago

## 2012-12-10 NOTE — ED Provider Notes (Signed)
Medical screening examination/treatment/procedure(s) were performed by a resident physician and as supervising physician I was immediately available for consultation/collaboration.  Leslee Home, M.D.  Reuben Likes, MD 12/10/12 [phone]

## 2012-12-10 NOTE — ED Notes (Signed)
Pt. transferred form [facility] this evening reports intermittent mid chest pain for 15 years , pt. stated blurred vision , generalized weakness " no energy " and headache . Denies chest pain at triage / no SOB .

## 2012-12-10 NOTE — ED Provider Notes (Signed)
CSN: [id number]     Arrival date & time 12/10/12  1819 History   None    Chief Complaint  Patient presents with  . Chest Pain   (Consider location/radiation/quality/duration/timing/severity/associated sxs/prior Treatment) HPI  Here for chest pain. Ice pick sensation, sharp. Comes on for seconds then gone. Occur 3-4 x daily a few times a week. Radiates to L and R arm from time to time for seconds then gone. Just started losing vision at middle (7-50 feet) to long distances. Becoming blurry. No change w/ or w/o glasses. Sweating more profusely lately. HA w/o relief w/ ibuprofen 1000mg . Feeling foggy and tired. HAs not been to PCP in quite some time. Goofed up speech, knew what he wanted to say but couldn't say it. Fmhx of brother w/ stroke and MI.   Abd pain off and on since cholecystectomy w/ movement. Only lasts seconds. BMs nml  Past Medical History  Diagnosis Date  . Chest pain     10 or more episodes- undiagnosed with frequent ED visits   Past Surgical History  Procedure Laterality Date  . Cholecystectomy N/A 09/14/2012    Procedure: LAPAROSCOPIC CHOLECYSTECTOMY WITH INTRAOPERATIVE CHOLANGIOGRAM;  Surgeon: Wilmon Arms. Corliss Skains, MD;  Location: [facility] OR;  Service: General;  Laterality: N/A;  . Hernia repair  1968   Family History  Problem Relation Age of Onset  . Diabetes    . Heart disease Brother 39    stents  . Hyperlipidemia    . Hypertension    . Lung cancer    . Hypertension Mother   . Diabetes Mother   . Hypertension Father    History  Substance Use Topics  . Smoking status: Current Every Day Smoker -- 1.00 packs/day    Types: Cigarettes  . Smokeless tobacco: Not on file  . Alcohol Use: 1.2 oz/week    2 Shots of liquor per week     Comment: Social drinker    Review of Systems  Constitutional: Positive for diaphoresis, activity change and fatigue. Negative for fever, chills and unexpected weight change.  Respiratory: Positive for shortness of breath.   Cardiovascular:  Positive for chest pain.  Neurological: Positive for speech difficulty, weakness and headaches. Negative for tremors, seizures, syncope, light-headedness and numbness.       Vision change    Allergies  Review of patient's allergies indicates no known allergies.  Home Medications   Current Outpatient Rx  Name  Route  Sig  Dispense  Refill  . ibuprofen (ADVIL,MOTRIN) 200 MG tablet   Oral   Take 800 mg by mouth every 6 (six) hours as needed for pain.         Marland Kitchen amoxicillin-clavulanate (AUGMENTIN) 875-125 MG per tablet   Oral   Take 1 tablet by mouth 2 (two) times daily.   14 tablet   0   . oxyCODONE (OXY IR/ROXICODONE) 5 MG immediate release tablet   Oral   Take 1-2 tablets (5-10 mg total) by mouth every 4 (four) hours as needed for pain.   30 tablet   0    BP 131/79  Pulse 74  Temp(Src) 97.9 F (36.6 C) (Oral)  Resp 18  SpO2 98% Physical Exam  Constitutional: He is oriented to person, place, and time. He appears well-developed and well-nourished. No distress.  HENT:  Head: Normocephalic and atraumatic.  Eyes: EOM are normal. Pupils are equal, round, and reactive to light.  Neck: Normal range of motion.  Cardiovascular: Normal rate, regular rhythm, normal heart sounds and  intact distal pulses.   No murmur heard. No JVD, and no carotid bruits  Pulmonary/Chest: Effort normal and breath sounds normal. No respiratory distress.  Abdominal: Soft. Bowel sounds are normal.  Musculoskeletal: Normal range of motion. He exhibits no edema and no tenderness.  Neurological: He is alert and oriented to person, place, and time. He has normal reflexes. He displays normal reflexes. No cranial nerve deficit. He exhibits normal muscle tone. Coordination normal.  Skin: Skin is warm and dry. No rash noted. No erythema. No pallor.  Psychiatric: He has a normal mood and affect. His behavior is normal. Judgment and thought content normal.  No pain on palpation of chest wall  ED Course   Procedures (including critical care time) Labs Review Labs Reviewed - No data to display Imaging Review No results found.  MDM   1. TIA (transient ischemic attack)   2. Atypical chest pain   3. Blurred vision   4. Abdominal pain    47yo M w/ concern for TIA given symptoms of blurry vision (odd distribution given on from 7-50 ft), altered speech, foggy feeling; atypical chest pain that does not appear cardiac in nature given significant workup but several risk factors; abdominal pain that is likely from adhesions from lap chole; possible autoimmune cause of symptoms - transport to [facility] for likely admission and workup including CT and labs. +/- neuro consult.   Shelly Flatten, MD Family Medicine PGY-3 12/10/2012, 8:05 PM      Ozella Rocks, MD 12/10/12 2005

## 2012-12-10 NOTE — ED Notes (Signed)
Chest pain in his chest that radiates to his L  arm onset 3 days ago. States he's been to the ED 10-15 times for it but never found anything wrong.  States he started losing far away vision 3 days ago.  States he hurts all over.  No energy today and forced himself to work all day.  Was sweating all day. C/o 3 day headache above his R eye.  Tried Ibuprofen 1000 mg. without relief.  No nausea or SOB.  Started getting pain in his RUQ 3-4 days ago.  Had is gallbladder removed 7/5 but states its the same type of pain.  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 The words got mixed up.  States all his teeth hurt.

## 2012-12-13 NOTE — ED Provider Notes (Addendum)
  I performed a history and physical examination of Tim Rubio and discussed his management with Dr. Littie Deeds.  I agree with the history, physical, assessment, and plan of care, with the following exceptions: None  I was present for the following procedures: None Time Spent in Critical Care of the patient: None Time spent in discussions with the patient and family: 5  Tanis Hensarling Corlis Leak, MD 12/13/12 1921  Reviewed and agree with Dr. Janann August ekg interpretation.   Hilario Quarry, MD 01/06/13 [phone]

## 2018-11-18 ENCOUNTER — Ambulatory Visit (INDEPENDENT_AMBULATORY_CARE_PROVIDER_SITE_OTHER): Payer: Self-pay

## 2018-11-18 ENCOUNTER — Ambulatory Visit (HOSPITAL_COMMUNITY)
Admission: EM | Admit: 2018-11-18 | Discharge: 2018-11-18 | Disposition: A | Discharge status: Home / Self Care | Payer: Self-pay | Attending: Family Medicine | Admitting: Family Medicine

## 2018-11-18 ENCOUNTER — Other Ambulatory Visit: Payer: Self-pay

## 2018-11-18 ENCOUNTER — Encounter (HOSPITAL_COMMUNITY): Payer: Self-pay

## 2018-11-18 DIAGNOSIS — M79602 Pain in left arm: Secondary | ICD-10-CM

## 2018-11-18 DIAGNOSIS — W19XXXA Unspecified fall, initial encounter: Secondary | ICD-10-CM

## 2018-11-18 MED ORDER — TIZANIDINE HCL 4 MG PO TABS: 4.0000 mg | ORAL_TABLET | Freq: Four times a day (QID) | ORAL | 0 refills | Status: AC | PRN

## 2018-11-18 MED ORDER — HYDROCODONE-ACETAMINOPHEN 7.5-325 MG PO TABS: 1.0000 | ORAL_TABLET | Freq: Four times a day (QID) | ORAL | 0 refills | Status: AC | PRN

## 2018-11-18 MED ORDER — METHYLPREDNISOLONE 4 MG PO TBPK: ORAL_TABLET | ORAL | 0 refills | Status: AC

## 2018-11-18 NOTE — ED Provider Notes (Signed)
[facility]    CSN: [id number] Arrival date & time: 11/18/18  1104      History   Chief Complaint Chief Complaint  Patient presents with  . Fall  . Arm Pain    HPI Tim Rubio is a 53 y.o. male.   HPI  Patient was standing up in a pocket.  Fell forward.  Landed on his left elbow.  He states that it "jammed" into his left shoulder.  Left shoulder continues to be very painful.  Pain with movement.  He is holding his arm up close to his body.  He is having difficulty sleeping at night.  Elbow forearm wrist and hand are normal  Past Medical History:  Diagnosis Date  . Chest pain    10 or more episodes- undiagnosed with frequent ED visits    Patient Active Problem List   Diagnosis Date Noted  . Calculus of gallbladder with acute cholecystitis s/p lap chole with IOC 7/5. 09/15/2012  . Chest pain 04/12/2011    Past Surgical History:  Procedure Laterality Date  . CHOLECYSTECTOMY N/A 09/14/2012   Procedure: LAPAROSCOPIC CHOLECYSTECTOMY WITH INTRAOPERATIVE CHOLANGIOGRAM;  Surgeon: Wilmon ArmsMatthew K. Corliss Skainssuei, MD;  Location: [facility] OR;  Service: General;  Laterality: N/A;  . HERNIA REPAIR  1968       Home Medications    Prior to Admission medications   Medication Sig Start Date End Date Taking? Authorizing Provider  HYDROcodone-acetaminophen (NORCO) 7.5-325 MG tablet Take 1 tablet by mouth every 6 (six) hours as needed for moderate pain. 11/18/18   Eustace MooreNelson, Terisa Belardo Sue, MD  ibuprofen (ADVIL,MOTRIN) 200 MG tablet Take 800 mg by mouth every 6 (six) hours as needed for pain.    [provider]  methylPREDNISolone (MEDROL DOSEPAK) 4 MG TBPK tablet TAD 11/18/18   Eustace MooreNelson, Keifer Habib Sue, MD  tiZANidine (ZANAFLEX) 4 MG tablet Take 1-2 tablets (4-8 mg total) by mouth every 6 (six) hours as needed for muscle spasms. 11/18/18   Eustace MooreNelson, Adie Vilar Sue, MD    Family History Family History  Problem Relation Age of Onset  . Hypertension Mother   . Diabetes Mother   . Hypertension Father   .  Diabetes Other   . Heart disease Brother 39       stents  . Hyperlipidemia Other   . Hypertension Other   . Lung cancer Other     Social History Social History   Tobacco Use  . Smoking status: Former Smoker    Packs/day: 1.00    Years: 40.00    Pack years: 40.00    Types: Cigarettes  . Smokeless tobacco: Never Used  . Tobacco comment: cigarette free for 10 days (as of 11/18/2018)  Substance Use Topics  . Alcohol use: Yes    Alcohol/week: 2.0 standard drinks    Types: 2 Shots of liquor per week    Comment: Social drinker  . Drug use: No     Allergies   Patient has no known allergies.   Review of Systems Review of Systems  Constitutional: Negative for chills and fever.  HENT: Negative for ear pain and sore throat.   Eyes: Negative for pain and visual disturbance.  Respiratory: Negative for cough and shortness of breath.   Cardiovascular: Negative for chest pain and palpitations.  Gastrointestinal: Negative for abdominal pain and vomiting.  Genitourinary: Negative for dysuria and hematuria.  Musculoskeletal: Positive for arthralgias. Negative for back pain.  Skin: Negative for color change and rash.  Neurological: Negative for seizures and  syncope.  All other systems reviewed and are negative.    Physical Exam Triage Vital Signs ED Triage Vitals  Enc Vitals Group     BP 11/18/18 1238 129/85     Pulse Rate 11/18/18 1238 70     Resp 11/18/18 1238 16     Temp 11/18/18 1238 98.3 F (36.8 C)     Temp Source 11/18/18 1238 Temporal     SpO2 11/18/18 1238 100 %     Weight --      Height --      Head Circumference --      Peak Flow --      Pain Score 11/18/18 1236 7     Pain Loc --      Pain Edu? --      Excl. in [place]? --    No data found.  Updated Vital Signs BP 129/85 (BP Location: Right Arm)   Pulse 70   Temp 98.3 F (36.8 C) (Temporal)   Resp 16   SpO2 100%      Physical Exam Constitutional:      General: He is not in acute distress.     Appearance: He is well-developed.  HENT:     Head: Normocephalic and atraumatic.  Eyes:     Conjunctiva/sclera: Conjunctivae normal.     Pupils: Pupils are equal, round, and reactive to light.  Neck:     Musculoskeletal: Normal range of motion.  Cardiovascular:     Rate and Rhythm: Normal rate.  Pulmonary:     Effort: Pulmonary effort is normal. No respiratory distress.  Abdominal:     General: There is no distension.     Palpations: Abdomen is soft.  Musculoskeletal: Normal range of motion.     Comments: Patient symptoms obvious discomfort.  Holds left arm close to body.  No tenderness in the hand wrist or elbow.  No tenderness in the distal humerus.  There is tenderness in the proximal humerus.  No tenderness over the scapula.  No tenderness over the [facility] joint or clavicle.  Limited range of motion secondary to pain.  He can abduct about 30 degrees.  Skin:    General: Skin is warm and dry.  Neurological:     Mental Status: He is alert.      UC Treatments / Results  Labs (all labs ordered are listed, but only abnormal results are displayed) Labs Reviewed - No data to display  EKG   Radiology Dg Shoulder Left  Result Date: 11/18/2018 CLINICAL DATA:  Left shoulder pain post fall 5 days ago. EXAM: LEFT SHOULDER - 2+ VIEW COMPARISON:  None. FINDINGS: There is no evidence of fracture or dislocation. There is no evidence of arthropathy or other focal bone abnormality. Soft tissues are unremarkable. IMPRESSION: Negative. Electronically Signed   By: Fidela Salisbury M.D.   On: 11/18/2018 13:17    Procedures Procedures (including critical care time)  Medications Ordered in UC Medications - No data to display  Initial Impression / Assessment and Plan / UC Course  I have reviewed the triage vital signs and the nursing notes.  Pertinent labs & imaging results that were available during my care of the patient were reviewed by me and considered in my medical decision making (see  chart for details).     X-ray is negative.  Discussed conservative management Final Clinical Impressions(s) / UC Diagnoses   Final diagnoses:  Left arm pain  Fall, initial encounter     Discharge Instructions  Take the Medrol Dosepak as prescribed.  Take all of day 1 today.  Medrol is a stronger anti-inflammatory that will take down swelling and pain around her shoulder joint Take the tizanidine as a muscle relaxer.  You have tight muscles around her neck and shoulder.  This is also causing you pain I have prescribed hydrocodone for the pain.  Do not drive when you take hydrocodone.  This is so that you can get some sleep. If you do not see improvement over the next few days you may need to see an orthopedic surgeon.  Please call for problems    ED Prescriptions    Medication Sig Dispense Auth. Provider   methylPREDNISolone (MEDROL DOSEPAK) 4 MG TBPK tablet TAD 21 tablet Eustace Moore, MD   HYDROcodone-acetaminophen (NORCO) 7.5-325 MG tablet Take 1 tablet by mouth every 6 (six) hours as needed for moderate pain. 15 tablet Eustace Moore, MD   tiZANidine (ZANAFLEX) 4 MG tablet Take 1-2 tablets (4-8 mg total) by mouth every 6 (six) hours as needed for muscle spasms. 21 tablet Eustace Moore, MD     Controlled Substance Prescriptions [place] Controlled Substance Registry consulted? Not Applicable   Eustace Moore, MD 11/18/18 2010

## 2018-11-18 NOTE — Discharge Instructions (Addendum)
Take the Medrol Dosepak as prescribed.  Take all of day 1 today.  Medrol is a stronger anti-inflammatory that will take down swelling and pain around her shoulder joint Take the tizanidine as a muscle relaxer.  You have tight muscles around her neck and shoulder.  This is also causing you pain I have prescribed hydrocodone for the pain.  Do not drive when you take hydrocodone.  This is so that you can get some sleep. If you do not see improvement over the next few days you may need to see an orthopedic surgeon.  Please call for problems

## 2018-11-18 NOTE — ED Triage Notes (Signed)
Patient presents to Urgent Care with complaints of left arm pain since 4-5 days ago. Patient reports he fell while using a bucket to stand on, fell on his left arm and has been trying to power through but it is not getting better, thinks it is getting worse.

## 2020-03-08 ENCOUNTER — Other Ambulatory Visit: Payer: Self-pay

## 2020-03-08 DIAGNOSIS — Z20822 Contact with and (suspected) exposure to covid-19: Secondary | ICD-10-CM

## 2020-03-09 LAB — NOVEL CORONAVIRUS, NAA: SARS-CoV-2, NAA: NOT DETECTED

## 2020-03-09 LAB — SARS-COV-2, NAA 2 DAY TAT

## 2020-03-19 ENCOUNTER — Encounter (HOSPITAL_COMMUNITY): Payer: Self-pay

## 2020-11-16 ENCOUNTER — Encounter (HOSPITAL_COMMUNITY): Payer: Self-pay

## 2020-11-16 ENCOUNTER — Ambulatory Visit (HOSPITAL_COMMUNITY)
Admission: EM | Admit: 2020-11-16 | Discharge: 2020-11-16 | Disposition: A | Discharge status: Home / Self Care | Payer: Self-pay | Attending: Student | Admitting: Student

## 2020-11-16 ENCOUNTER — Other Ambulatory Visit: Payer: Self-pay

## 2020-11-16 DIAGNOSIS — R198 Other specified symptoms and signs involving the digestive system and abdomen: Secondary | ICD-10-CM

## 2020-11-16 DIAGNOSIS — R1031 Right lower quadrant pain: Secondary | ICD-10-CM

## 2020-11-16 DIAGNOSIS — Z9049 Acquired absence of other specified parts of digestive tract: Secondary | ICD-10-CM

## 2020-11-16 LAB — POCT URINALYSIS DIPSTICK, ED / UC
Bilirubin Urine: NEGATIVE
Glucose, UA: NEGATIVE mg/dL
Hgb urine dipstick: NEGATIVE
Ketones, ur: NEGATIVE mg/dL
Leukocytes,Ua: NEGATIVE
Nitrite: NEGATIVE
Protein, ur: NEGATIVE mg/dL
Specific Gravity, Urine: 1.01 (ref 1.005–1.030)
Urobilinogen, UA: 0.2 mg/dL (ref 0.0–1.0)
pH: 6 (ref 5.0–8.0)

## 2020-11-16 NOTE — ED Triage Notes (Signed)
Pt reports right lower quadrant abdominal pain on and off x 3-4 months. States in the past 2-3 weeks pain improve when having a bowel movement.

## 2020-11-16 NOTE — ED Provider Notes (Signed)
[facility]    CSN: [id number] Arrival date & time: 11/16/20  1206      History   Chief Complaint Chief Complaint  Patient presents with   Abdominal Pain    HPI Tim Rubio is a 55 y.o. male presenting with RLQ pain x4 months. History cholecystectomy 2014.  States he typically has a bowel movement every day, but over the last 3 months he has become more constipated.  He did however have a normal bowel movement this morning.  Denies nausea, vomiting, blood in stool or vomit.  Denies recent weight loss.  Pain does seem to improve after he has a bowel movement.  He does note that he has an active job in [employer] and does a lot of heavy lifting, pain is worse after this.  He has never had a colonoscopy.  States that his son told him to come to this urgent care for evaluation.  Denies urinary symptoms.  HPI  Past Medical History:  Diagnosis Date   Chest pain    10 or more episodes- undiagnosed with frequent ED visits    Patient Active Problem List   Diagnosis Date Noted   Calculus of gallbladder with acute cholecystitis s/p lap chole with IOC 7/5. 09/15/2012   Chest pain 04/12/2011    Past Surgical History:  Procedure Laterality Date   CHOLECYSTECTOMY N/A 09/14/2012   Procedure: LAPAROSCOPIC CHOLECYSTECTOMY WITH INTRAOPERATIVE CHOLANGIOGRAM;  Surgeon: Wilmon Arms. Corliss Skains, MD;  Location: [facility];  Service: General;  Laterality: N/A;   HERNIA REPAIR  1968       Home Medications    Prior to Admission medications   Medication Sig Start Date End Date Taking? Authorizing Provider  HYDROcodone-acetaminophen (NORCO) 7.5-325 MG tablet Take 1 tablet by mouth every 6 (six) hours as needed for moderate pain. 11/18/18   Eustace Moore, MD  ibuprofen (ADVIL,MOTRIN) 200 MG tablet Take 800 mg by mouth every 6 (six) hours as needed for pain.    [provider]  methylPREDNISolone (MEDROL DOSEPAK) 4 MG TBPK tablet TAD 11/18/18   Eustace Moore, MD  tiZANidine  (ZANAFLEX) 4 MG tablet Take 1-2 tablets (4-8 mg total) by mouth every 6 (six) hours as needed for muscle spasms. 11/18/18   Eustace Moore, MD    Family History Family History  Problem Relation Age of Onset   Hypertension Mother    Diabetes Mother    Hypertension Father    Diabetes Other    Heart disease Brother 21       stents   Hyperlipidemia Other    Hypertension Other    Lung cancer Other     Social History Social History   Tobacco Use   Smoking status: Former    Packs/day: 1.00    Years: 40.00    Pack years: 40.00    Types: Cigarettes   Smokeless tobacco: Never   Tobacco comments:    cigarette free for 10 days (as of 11/18/2018)  Vaping Use   Vaping Use: Never used  Substance Use Topics   Alcohol use: Yes    Alcohol/week: 2.0 standard drinks    Types: 2 Shots of liquor per week    Comment: Social drinker   Drug use: No     Allergies   Patient has no known allergies.   Review of Systems Review of Systems  Gastrointestinal:  Positive for abdominal pain and constipation.  All other systems reviewed and are negative.   Physical Exam Triage Vital Signs  ED Triage Vitals  Enc Vitals Group     BP      Pulse      Resp      Temp      Temp src      SpO2      Weight      Height      Head Circumference      Peak Flow      Pain Score      Pain Loc      Pain Edu?      Excl. in GC?    No data found.  Updated Vital Signs BP 127/83 (BP Location: Right Arm)   Pulse 75   Temp 98.1 F (36.7 C) (Oral)   Resp 18   SpO2 97%   Visual Acuity Right Eye Distance:   Left Eye Distance:   Bilateral Distance:    Right Eye Near:   Left Eye Near:    Bilateral Near:     Physical Exam Vitals reviewed.  Constitutional:      General: He is not in acute distress.    Appearance: Normal appearance. He is not ill-appearing.  HENT:     Head: Normocephalic and atraumatic.     Mouth/Throat:     Mouth: Mucous membranes are moist.     Comments: Moist mucous  membranes Eyes:     Extraocular Movements: Extraocular movements intact.     Pupils: Pupils are equal, round, and reactive to light.  Cardiovascular:     Rate and Rhythm: Normal rate and regular rhythm.     Heart sounds: Normal heart sounds.  Pulmonary:     Effort: Pulmonary effort is normal.     Breath sounds: Normal breath sounds. No wheezing, rhonchi or rales.  Abdominal:     General: Bowel sounds are normal. There is no distension.     Palpations: Abdomen is soft. There is no mass.     Tenderness: There is no abdominal tenderness. There is no right CVA tenderness, left CVA tenderness, guarding or rebound. Negative signs include Murphy's sign, Rovsing's sign and McBurney's sign.     Comments: No reproducible tenderness to palpation. No mass. BS positive throughout.  Skin:    General: Skin is warm.     Capillary Refill: Capillary refill takes less than 2 seconds.     Comments: Good skin turgor  Neurological:     General: No focal deficit present.     Mental Status: He is alert and oriented to person, place, and time.  Psychiatric:        Mood and Affect: Mood normal.        Behavior: Behavior normal.     UC Treatments / Results  Labs (all labs ordered are listed, but only abnormal results are displayed) Labs Reviewed  POCT URINALYSIS DIPSTICK, ED / UC    EKG   Radiology No results found.  Procedures Procedures (including critical care time)  Medications Ordered in UC Medications - No data to display  Initial Impression / Assessment and Plan / UC Course  I have reviewed the triage vital signs and the nursing notes.  Pertinent labs & imaging results that were available during my care of the patient were reviewed by me and considered in my medical decision making (see chart for details).     This patient is a very pleasant 55 y.o. year old male presenting with RLQ pain and change in bowel function x4 months.   History cholecystectomy 2014.  UA  wnl. Has never  had a colonoscopy. Weight is stable. Given changes in bowel function and abd pain, I strongly recommend pt f/u with his PCP for colonoscopy. Ddx also includes hernia given history abd surgery (laparoscopic cholecystectomy 2014) and regular heavy lifting at work. May also require abd imaging that we cannot perform at urgent care. Low suspicion for appendix pathology, but discussed signs and symptoms of this.  STRICT ED return precautions discussed- symptoms worsen, head to ED. F/u with PCP at earliest convenience. Patient verbalizes understanding and agreement.    Final Clinical Impressions(s) / UC Diagnoses   Final diagnoses:  RLQ abdominal pain  History of cholecystectomy  Change in bowel function     Discharge Instructions      -I am concerned you have a hernia given your history abd surgery and your heavy lifting. This needs to be evaluated by a GI doctor. You also are 10 years overdue for a colonoscopy. Follow-up with PCP at their earliest convenience for GI referral. Severe abd pain, blood in vomit or stool- head to ED.     ED Prescriptions   None    PDMP not reviewed this encounter.   Rhys Martini, PA-C 11/16/20 1448

## 2020-11-16 NOTE — Discharge Instructions (Addendum)
-  I am concerned you have a hernia given your history abd surgery and your heavy lifting. This needs to be evaluated by a GI doctor. You also are 10 years overdue for a colonoscopy. Follow-up with PCP at their earliest convenience for GI referral. Severe abd pain, blood in vomit or stool- head to ED.

## 2021-01-07 IMAGING — DX DG SHOULDER 2+V*L*
4 series · 4 of 4 positions shown · non-contrast
Comparison: None.

CLINICAL DATA: Left shoulder pain post fall 5 days ago.

EXAM:
LEFT SHOULDER - 2+ VIEW

[shoulder ap]
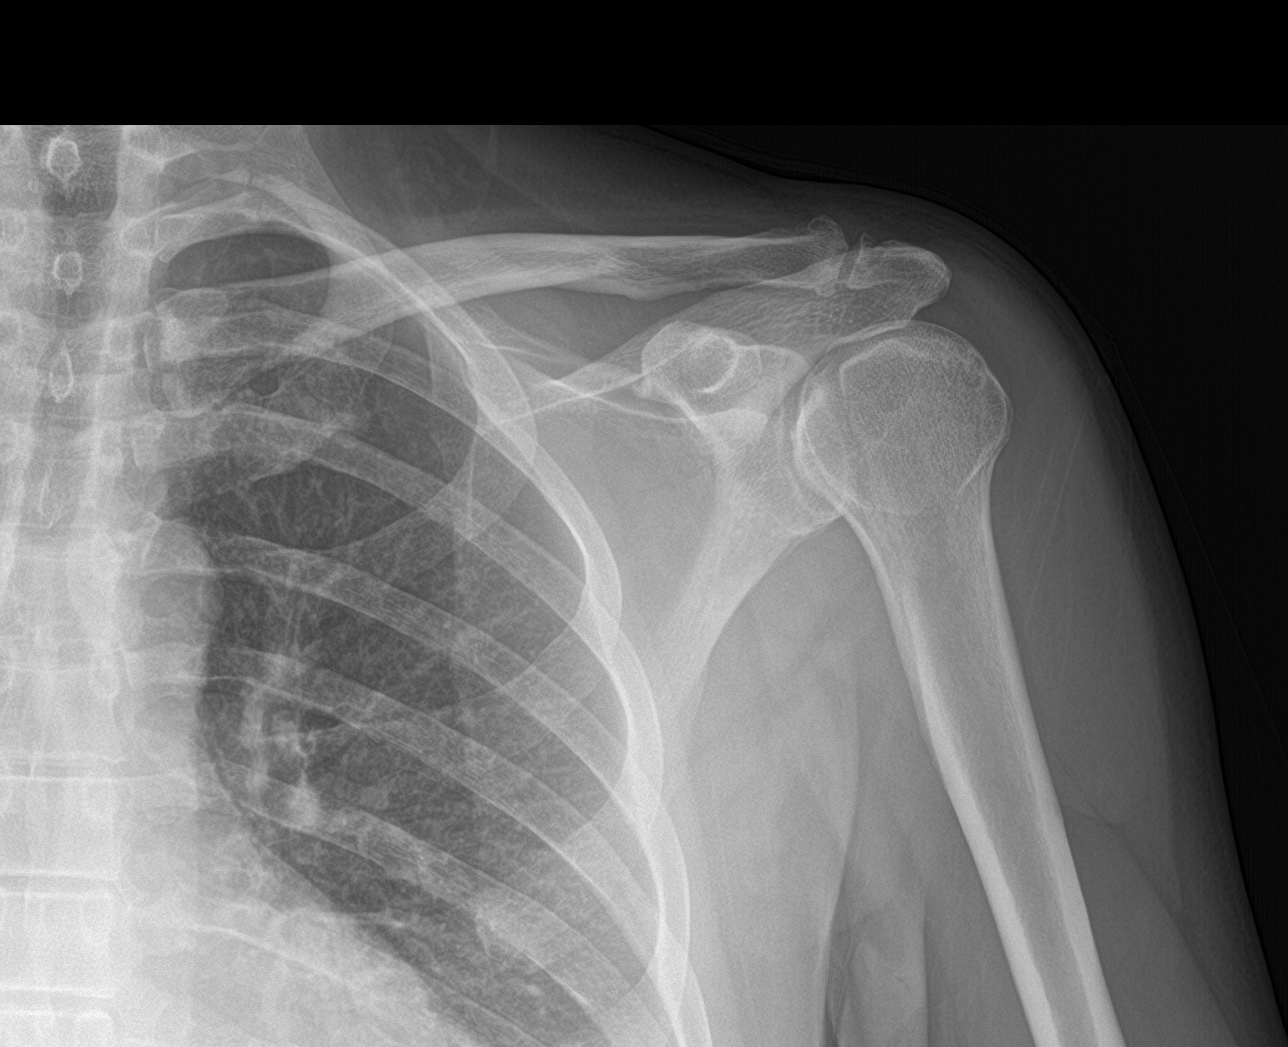

[shoulder grashey]
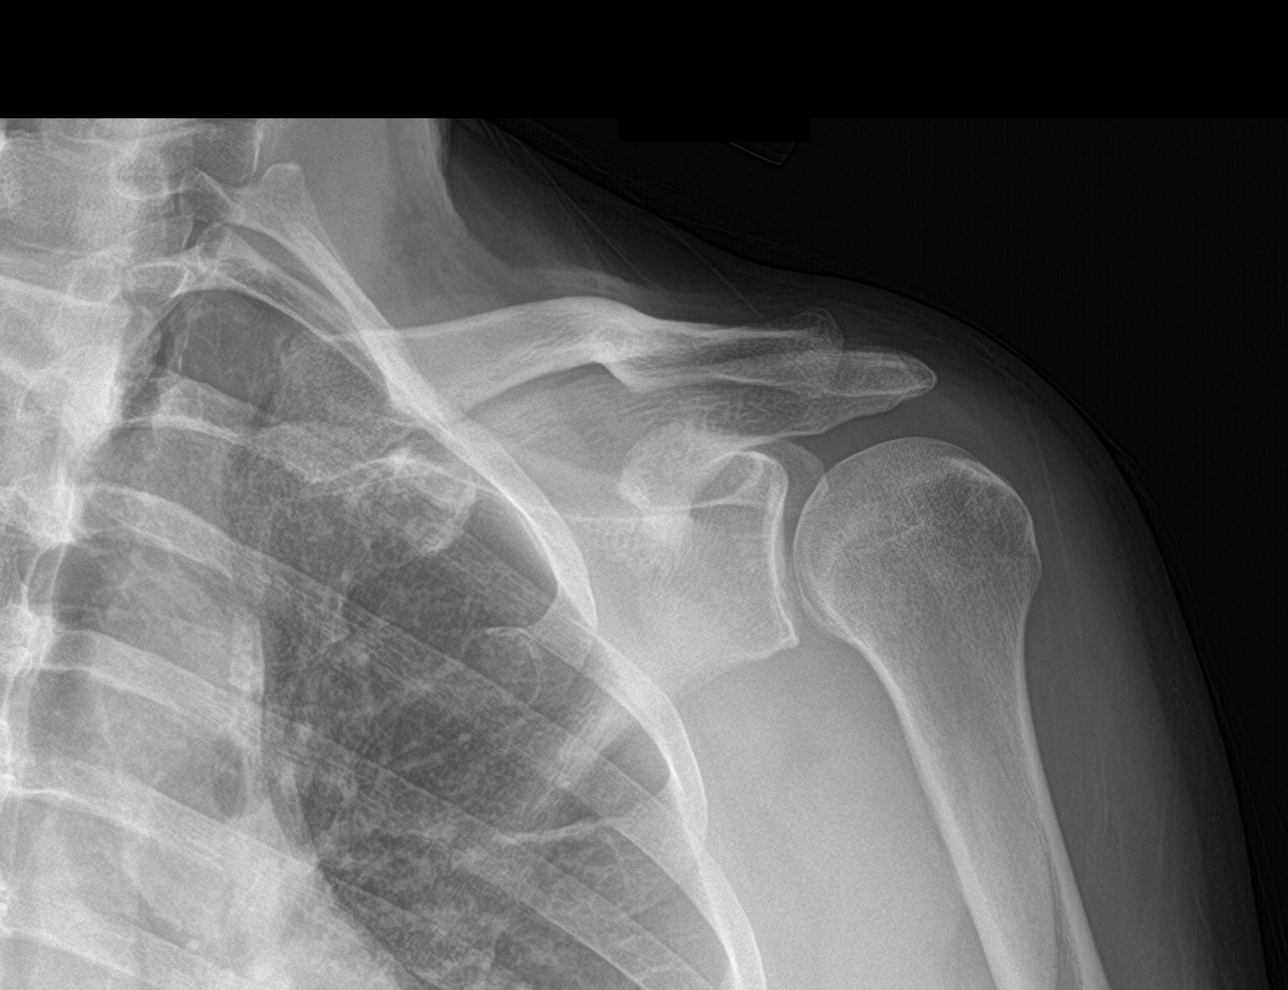

[shoulder y-view]
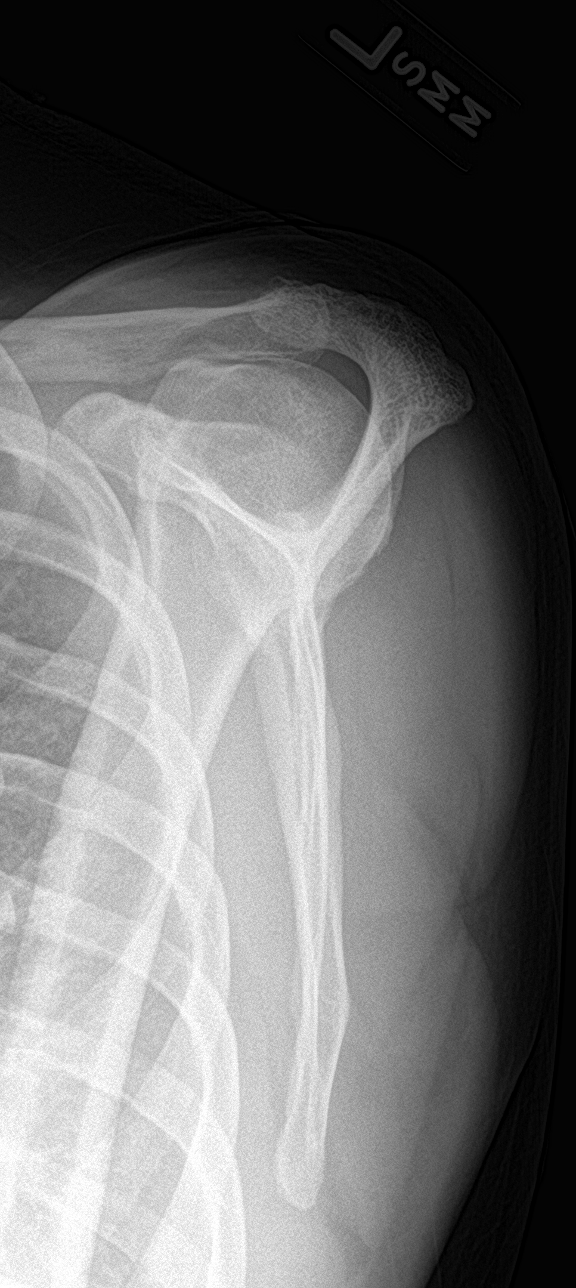

[shoulder axial]
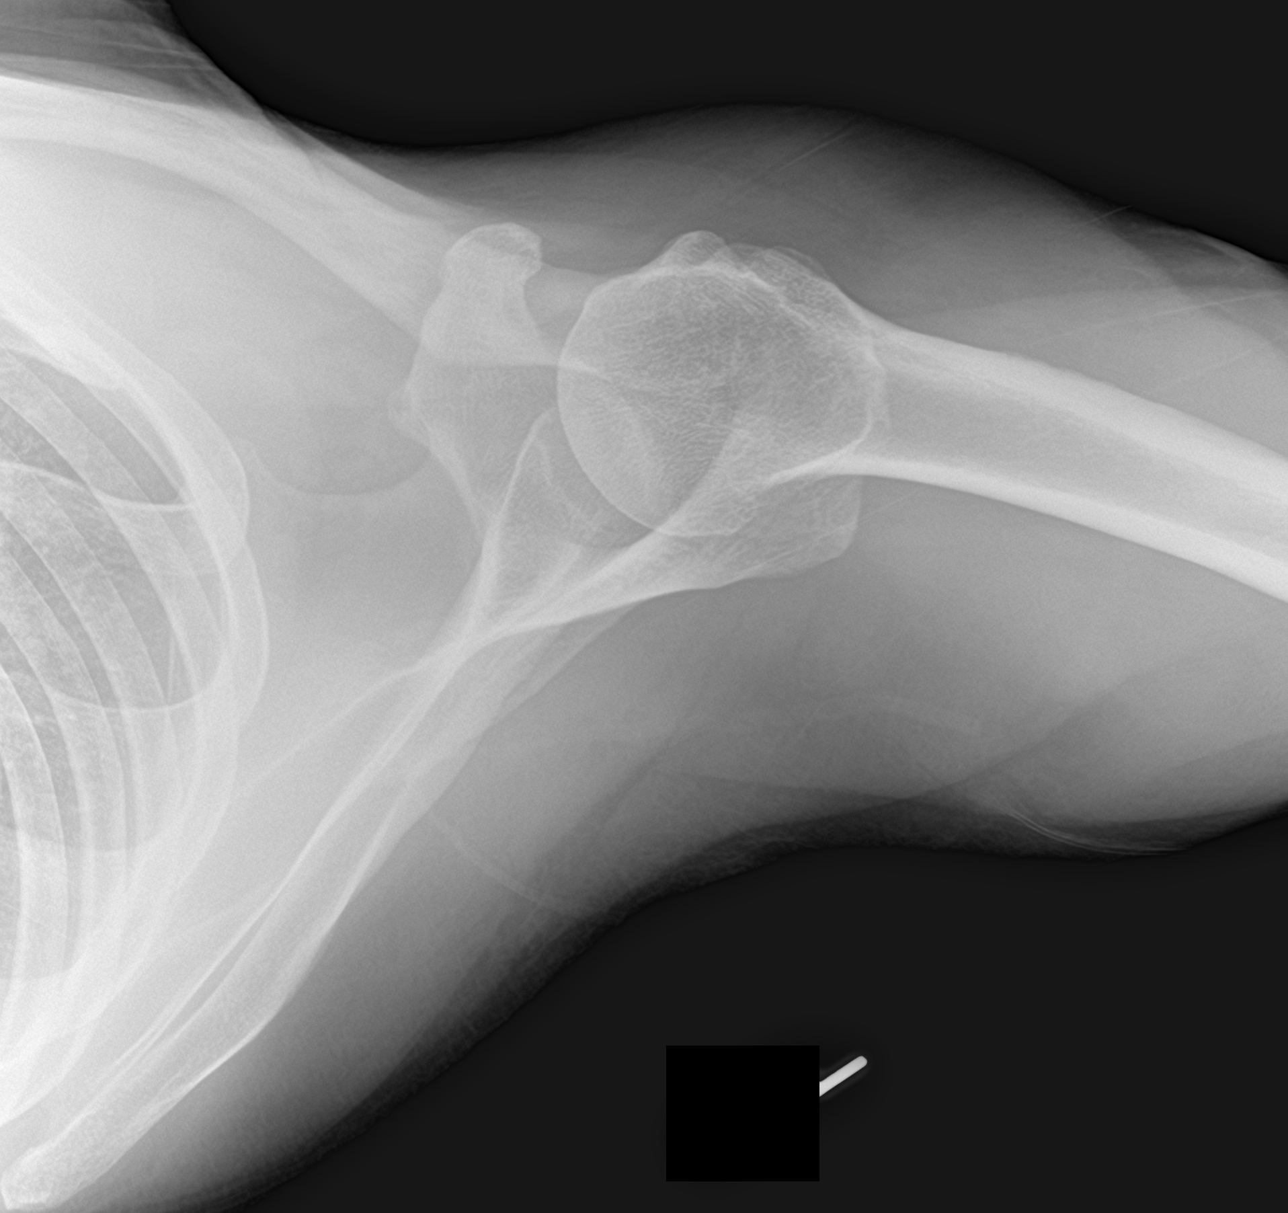

[4 of 4 positions shown; findings below may reference images not displayed]

FINDINGS: There is no evidence of fracture or dislocation. There is no
evidence of arthropathy or other focal bone abnormality. Soft
tissues are unremarkable.
IMPRESSION: Negative.
